# Patient Record
Sex: Female | Born: 1940 | Race: White | Hispanic: No | Smoking: Current every day smoker
Health system: Southern US, Community
[De-identification: ages and names within clinical notes are randomized; demographics above are authoritative.]

## PROBLEM LIST (undated history)

## (undated) DIAGNOSIS — L409 Psoriasis, unspecified: Secondary | ICD-10-CM

## (undated) DIAGNOSIS — IMO0002 Reserved for concepts with insufficient information to code with codable children: Secondary | ICD-10-CM

## (undated) DIAGNOSIS — I639 Cerebral infarction, unspecified: Secondary | ICD-10-CM

## (undated) DIAGNOSIS — I872 Venous insufficiency (chronic) (peripheral): Secondary | ICD-10-CM

## (undated) DIAGNOSIS — I6529 Occlusion and stenosis of unspecified carotid artery: Secondary | ICD-10-CM

## (undated) DIAGNOSIS — M1990 Unspecified osteoarthritis, unspecified site: Secondary | ICD-10-CM

## (undated) DIAGNOSIS — I10 Essential (primary) hypertension: Secondary | ICD-10-CM

## (undated) DIAGNOSIS — I82409 Acute embolism and thrombosis of unspecified deep veins of unspecified lower extremity: Secondary | ICD-10-CM

## (undated) DIAGNOSIS — M2550 Pain in unspecified joint: Secondary | ICD-10-CM

## (undated) DIAGNOSIS — J449 Chronic obstructive pulmonary disease, unspecified: Secondary | ICD-10-CM

## (undated) DIAGNOSIS — I509 Heart failure, unspecified: Secondary | ICD-10-CM

## (undated) HISTORY — DX: Cerebral infarction, unspecified: I63.9

## (undated) HISTORY — DX: Unspecified osteoarthritis, unspecified site: M19.90

## (undated) HISTORY — DX: Psoriasis, unspecified: L40.9

## (undated) HISTORY — PX: BLADDER SURGERY: SHX569

## (undated) HISTORY — PX: ABDOMINAL HYSTERECTOMY: SHX81

## (undated) HISTORY — DX: Occlusion and stenosis of unspecified carotid artery: I65.29

## (undated) HISTORY — PX: FOOT SURGERY: SHX648

## (undated) HISTORY — DX: Reserved for concepts with insufficient information to code with codable children: IMO0002

## (undated) HISTORY — DX: Acute embolism and thrombosis of unspecified deep veins of unspecified lower extremity: I82.409

## (undated) HISTORY — DX: Essential (primary) hypertension: I10

## (undated) HISTORY — DX: Pain in unspecified joint: M25.50

## (undated) HISTORY — DX: Chronic obstructive pulmonary disease, unspecified: J44.9

---

## 2002-12-08 ENCOUNTER — Emergency Department (HOSPITAL_COMMUNITY): Admission: EM | Admit: 2002-12-08 | Discharge: 2002-12-08 | Payer: Self-pay | Admitting: Emergency Medicine

## 2002-12-08 ENCOUNTER — Encounter: Payer: Self-pay | Admitting: Emergency Medicine

## 2002-12-26 ENCOUNTER — Encounter: Payer: Self-pay | Admitting: Family Medicine

## 2002-12-26 ENCOUNTER — Ambulatory Visit (HOSPITAL_COMMUNITY): Admission: RE | Admit: 2002-12-26 | Discharge: 2002-12-26 | Payer: Self-pay | Admitting: Family Medicine

## 2006-01-22 ENCOUNTER — Inpatient Hospital Stay (HOSPITAL_COMMUNITY): Admission: EM | Admit: 2006-01-22 | Discharge: 2006-01-28 | Payer: Self-pay | Admitting: Emergency Medicine

## 2006-01-27 ENCOUNTER — Ambulatory Visit: Payer: Self-pay | Admitting: Orthopedic Surgery

## 2007-06-26 ENCOUNTER — Ambulatory Visit: Payer: Self-pay | Admitting: Internal Medicine

## 2007-06-26 DIAGNOSIS — N318 Other neuromuscular dysfunction of bladder: Secondary | ICD-10-CM

## 2007-06-26 DIAGNOSIS — F411 Generalized anxiety disorder: Secondary | ICD-10-CM | POA: Insufficient documentation

## 2007-06-26 DIAGNOSIS — J4489 Other specified chronic obstructive pulmonary disease: Secondary | ICD-10-CM | POA: Insufficient documentation

## 2007-06-26 DIAGNOSIS — Z87442 Personal history of urinary calculi: Secondary | ICD-10-CM

## 2007-06-26 DIAGNOSIS — R0602 Shortness of breath: Secondary | ICD-10-CM

## 2007-06-26 DIAGNOSIS — J449 Chronic obstructive pulmonary disease, unspecified: Secondary | ICD-10-CM

## 2007-06-26 DIAGNOSIS — N390 Urinary tract infection, site not specified: Secondary | ICD-10-CM

## 2007-06-26 DIAGNOSIS — M129 Arthropathy, unspecified: Secondary | ICD-10-CM | POA: Insufficient documentation

## 2007-06-26 DIAGNOSIS — I83009 Varicose veins of unspecified lower extremity with ulcer of unspecified site: Secondary | ICD-10-CM

## 2007-06-26 DIAGNOSIS — L97909 Non-pressure chronic ulcer of unspecified part of unspecified lower leg with unspecified severity: Secondary | ICD-10-CM

## 2007-06-27 ENCOUNTER — Telehealth (INDEPENDENT_AMBULATORY_CARE_PROVIDER_SITE_OTHER): Payer: Self-pay | Admitting: *Deleted

## 2007-06-27 ENCOUNTER — Encounter (INDEPENDENT_AMBULATORY_CARE_PROVIDER_SITE_OTHER): Payer: Self-pay | Admitting: Internal Medicine

## 2007-06-28 ENCOUNTER — Telehealth (INDEPENDENT_AMBULATORY_CARE_PROVIDER_SITE_OTHER): Payer: Self-pay | Admitting: *Deleted

## 2007-06-28 LAB — CONVERTED CEMR LAB
Basophils Absolute: 0.1 10*3/uL (ref 0.0–0.1)
Cholesterol: 193 mg/dL (ref 0–200)
HCT: 46.2 % — ABNORMAL HIGH (ref 36.0–46.0)
HDL: 35 mg/dL — ABNORMAL LOW (ref >39)
Hemoglobin: 14.9 g/dL (ref 12.0–15.0)
Lymphocytes Relative: 19 % (ref 12–46)
Lymphs Abs: 2.3 10*3/uL (ref 0.7–4.0)
Neutro Abs: 8.4 10*3/uL — ABNORMAL HIGH (ref 1.7–7.7)
Platelets: 381 10*3/uL (ref 150–400)
RDW: 14.1 % (ref 11.5–15.5)
Total CHOL/HDL Ratio: 5.5
VLDL: 35 mg/dL (ref 0–40)
WBC: 12 10*3/uL — ABNORMAL HIGH (ref 4.0–10.5)

## 2007-07-04 ENCOUNTER — Ambulatory Visit (HOSPITAL_COMMUNITY): Admission: RE | Admit: 2007-07-04 | Discharge: 2007-07-04 | Payer: Self-pay | Admitting: Internal Medicine

## 2007-07-04 ENCOUNTER — Encounter (INDEPENDENT_AMBULATORY_CARE_PROVIDER_SITE_OTHER): Payer: Self-pay | Admitting: Internal Medicine

## 2007-07-05 ENCOUNTER — Ambulatory Visit: Payer: Self-pay | Admitting: Internal Medicine

## 2007-07-05 DIAGNOSIS — R030 Elevated blood-pressure reading, without diagnosis of hypertension: Secondary | ICD-10-CM

## 2007-07-08 ENCOUNTER — Telehealth (INDEPENDENT_AMBULATORY_CARE_PROVIDER_SITE_OTHER): Payer: Self-pay | Admitting: *Deleted

## 2007-07-22 ENCOUNTER — Telehealth (INDEPENDENT_AMBULATORY_CARE_PROVIDER_SITE_OTHER): Payer: Self-pay | Admitting: *Deleted

## 2007-07-23 ENCOUNTER — Encounter (INDEPENDENT_AMBULATORY_CARE_PROVIDER_SITE_OTHER): Payer: Self-pay | Admitting: Internal Medicine

## 2007-07-31 ENCOUNTER — Ambulatory Visit: Payer: Self-pay | Admitting: Internal Medicine

## 2007-09-05 ENCOUNTER — Ambulatory Visit (HOSPITAL_COMMUNITY): Admission: RE | Admit: 2007-09-05 | Discharge: 2007-09-05 | Payer: Self-pay | Admitting: Ophthalmology

## 2007-10-10 ENCOUNTER — Ambulatory Visit (HOSPITAL_COMMUNITY): Admission: RE | Admit: 2007-10-10 | Discharge: 2007-10-10 | Payer: Self-pay | Admitting: Ophthalmology

## 2008-01-13 ENCOUNTER — Ambulatory Visit: Payer: Self-pay | Admitting: Internal Medicine

## 2008-01-13 DIAGNOSIS — J441 Chronic obstructive pulmonary disease with (acute) exacerbation: Secondary | ICD-10-CM | POA: Insufficient documentation

## 2008-01-27 ENCOUNTER — Ambulatory Visit (HOSPITAL_COMMUNITY): Admission: RE | Admit: 2008-01-27 | Discharge: 2008-01-27 | Payer: Self-pay | Admitting: Internal Medicine

## 2008-01-27 ENCOUNTER — Ambulatory Visit: Payer: Self-pay | Admitting: Internal Medicine

## 2008-01-27 LAB — CONVERTED CEMR LAB
Basophils Absolute: 0 10*3/uL (ref 0.0–0.1)
Basophils Relative: 0 % (ref 0–1)
Calcium: 8.7 mg/dL (ref 8.4–10.5)
Eosinophils Absolute: 1 10*3/uL — ABNORMAL HIGH (ref 0.0–0.7)
Eosinophils Relative: 7 % — ABNORMAL HIGH (ref 0–5)
Glucose, Bld: 122 mg/dL — ABNORMAL HIGH (ref 70–99)
HCT: 34.4 % — ABNORMAL LOW (ref 36.0–46.0)
Lymphs Abs: 1.4 10*3/uL (ref 0.7–4.0)
MCHC: 33.5 g/dL (ref 30.0–36.0)
MCV: 89.5 fL (ref 78.0–100.0)
Neutrophils Relative %: 75 % (ref 43–77)
Platelets: 366 10*3/uL (ref 150–400)
Pro B Natriuretic peptide (BNP): 107 pg/mL — ABNORMAL HIGH (ref 0.0–100.0)
RDW: 13.8 % (ref 11.5–15.5)
Sodium: 139 meq/L (ref 135–145)

## 2008-02-03 ENCOUNTER — Ambulatory Visit: Payer: Self-pay | Admitting: Internal Medicine

## 2008-10-10 ENCOUNTER — Encounter (INDEPENDENT_AMBULATORY_CARE_PROVIDER_SITE_OTHER): Payer: Self-pay | Admitting: Internal Medicine

## 2008-10-26 ENCOUNTER — Ambulatory Visit: Payer: Self-pay | Admitting: Internal Medicine

## 2008-11-02 ENCOUNTER — Ambulatory Visit: Payer: Self-pay | Admitting: Internal Medicine

## 2008-11-02 DIAGNOSIS — L03119 Cellulitis of unspecified part of limb: Secondary | ICD-10-CM

## 2008-11-02 DIAGNOSIS — L02419 Cutaneous abscess of limb, unspecified: Secondary | ICD-10-CM

## 2008-11-02 LAB — CONVERTED CEMR LAB
Basophils Absolute: 0.1 10*3/uL (ref 0.0–0.1)
Hemoglobin: 12.5 g/dL (ref 12.0–15.0)
Lymphocytes Relative: 16 % (ref 12–46)
Lymphs Abs: 1.8 10*3/uL (ref 0.7–4.0)
Monocytes Absolute: 0.7 10*3/uL (ref 0.1–1.0)
Neutro Abs: 8.1 10*3/uL — ABNORMAL HIGH (ref 1.7–7.7)
RDW: 14.3 % (ref 11.5–15.5)
Sed Rate: 64 mm/hr — ABNORMAL HIGH (ref 0–22)
WBC: 11.3 10*3/uL — ABNORMAL HIGH (ref 4.0–10.5)

## 2008-11-03 ENCOUNTER — Encounter (INDEPENDENT_AMBULATORY_CARE_PROVIDER_SITE_OTHER): Payer: Self-pay | Admitting: Internal Medicine

## 2008-11-03 ENCOUNTER — Ambulatory Visit (HOSPITAL_COMMUNITY): Admission: RE | Admit: 2008-11-03 | Discharge: 2008-11-03 | Payer: Self-pay | Admitting: Internal Medicine

## 2008-11-03 LAB — CONVERTED CEMR LAB
CO2: 26 meq/L (ref 19–32)
Calcium: 9.3 mg/dL (ref 8.4–10.5)
Sodium: 138 meq/L (ref 135–145)

## 2008-11-06 ENCOUNTER — Ambulatory Visit: Payer: Self-pay | Admitting: Internal Medicine

## 2008-11-10 ENCOUNTER — Encounter (INDEPENDENT_AMBULATORY_CARE_PROVIDER_SITE_OTHER): Payer: Self-pay | Admitting: Internal Medicine

## 2008-11-20 ENCOUNTER — Encounter (INDEPENDENT_AMBULATORY_CARE_PROVIDER_SITE_OTHER): Payer: Self-pay | Admitting: Internal Medicine

## 2009-06-08 ENCOUNTER — Inpatient Hospital Stay (HOSPITAL_COMMUNITY)
Admission: AD | Admit: 2009-06-08 | Discharge: 2009-06-16 | Payer: Self-pay | Source: Home / Self Care | Admitting: Family Medicine

## 2009-07-02 ENCOUNTER — Inpatient Hospital Stay (HOSPITAL_COMMUNITY): Admission: EM | Admit: 2009-07-02 | Discharge: 2009-07-08 | Payer: Self-pay | Admitting: Emergency Medicine

## 2009-08-25 ENCOUNTER — Observation Stay (HOSPITAL_COMMUNITY): Admission: EM | Admit: 2009-08-25 | Discharge: 2009-08-27 | Payer: Self-pay | Admitting: Emergency Medicine

## 2010-01-05 ENCOUNTER — Inpatient Hospital Stay (HOSPITAL_COMMUNITY): Admission: EM | Admit: 2010-01-05 | Discharge: 2010-01-10 | Payer: Self-pay | Admitting: Emergency Medicine

## 2010-03-01 ENCOUNTER — Inpatient Hospital Stay (HOSPITAL_COMMUNITY)
Admission: EM | Admit: 2010-03-01 | Discharge: 2010-03-12 | Disposition: A | Discharge status: Other Acute Inpatient Hospital | Payer: Self-pay | Source: Home / Self Care | Attending: Family Medicine | Admitting: Family Medicine

## 2010-03-08 ENCOUNTER — Encounter: Payer: Self-pay | Admitting: Neurology

## 2010-03-08 ENCOUNTER — Inpatient Hospital Stay (HOSPITAL_COMMUNITY)
Admission: AD | Admit: 2010-03-08 | Discharge: 2010-03-12 | Payer: Self-pay | Attending: Internal Medicine | Admitting: Internal Medicine

## 2010-03-13 HISTORY — PX: CAROTID STENT: SHX1301

## 2010-03-21 ENCOUNTER — Encounter
Admission: RE | Admit: 2010-03-21 | Discharge: 2010-03-21 | Payer: Self-pay | Source: Home / Self Care | Attending: Surgery | Admitting: Surgery

## 2010-03-21 ENCOUNTER — Ambulatory Visit
Admission: RE | Admit: 2010-03-21 | Discharge: 2010-03-21 | Payer: Self-pay | Source: Home / Self Care | Attending: Surgery | Admitting: Surgery

## 2010-03-21 ENCOUNTER — Encounter: Admission: RE | Admit: 2010-03-21 | Payer: Self-pay | Source: Home / Self Care | Admitting: Surgery

## 2010-04-03 ENCOUNTER — Encounter: Payer: Self-pay | Admitting: Family Medicine

## 2010-04-05 ENCOUNTER — Inpatient Hospital Stay (HOSPITAL_COMMUNITY)
Admission: RE | Admit: 2010-04-05 | Discharge: 2010-04-05 | Payer: Self-pay | Source: Home / Self Care | Attending: Surgery | Admitting: Surgery

## 2010-04-06 LAB — POCT I-STAT, CHEM 8
BUN: 48 mg/dL — ABNORMAL HIGH (ref 6–23)
Creatinine, Ser: 1.3 mg/dL — ABNORMAL HIGH (ref 0.4–1.2)
Hemoglobin: 12.2 g/dL (ref 12.0–15.0)
Potassium: 4.6 mEq/L (ref 3.5–5.1)
Sodium: 139 mEq/L (ref 135–145)
TCO2: 28 mmol/L (ref 0–100)

## 2010-04-06 LAB — GLUCOSE, CAPILLARY
Glucose-Capillary: 97 mg/dL (ref 70–99)
Glucose-Capillary: 90 mg/dL (ref 70–99)

## 2010-04-13 ENCOUNTER — Other Ambulatory Visit: Payer: Self-pay | Admitting: Surgery

## 2010-04-13 DIAGNOSIS — R222 Localized swelling, mass and lump, trunk: Secondary | ICD-10-CM

## 2010-04-24 NOTE — Op Note (Addendum)
NAMEAYBREE, LANYON NO.:  [id number]  MEDICAL RECORD NO.:  [id number]          PATIENT TYPE:  INP  LOCATION:  2852                         FACILITY:  [facility]  PHYSICIAN:  Juleen China IV, MDDATE OF BIRTH:  1940/08/02  DATE OF PROCEDURE:  04/05/2010 DATE OF DISCHARGE:                              OPERATIVE REPORT   PREOPERATIVE DIAGNOSIS:  Right brain stroke.  POSTOPERATIVE DIAGNOSIS:  Right brain stroke.  PROCEDURES PERFORMED: 1. Ultrasound access right femoral artery. 2. Aortic arch angiogram. 3. Bilateral carotid angiogram.  INDICATIONS:  Ms. Rodier is a 70 year old female who, over Christmas, developed signs and symptoms of a right brain stroke.  MRA confirmed acute process on the right brain with chronic old strokes on the left side.  Ultrasound revealed 70% carotid stenosis bilaterally.  The patient has recovered from her stroke.  I studied her with a CT scan which suggested greater than 50% stenosis in the right carotid artery and a high-grade left carotid stenosis.  The patient comes in today for diagnostic carotid angiogram and possible right carotid stent.  PROCEDURE:  The patient was identified in the holding area, taken to room 8, placed supine on the table.  Both groins were prepped and draped in the usual fashion.  Time-out was called.  The right femoral artery was evaluated with ultrasound and found to be widely patent.  Digital image was acquired.  It was then accessed under ultrasound guidance with an 18-gauge needle.  A 0.35 wire was advanced in the aorta under fluoroscopic visualization and a 5-French sheath was placed.  Next, a pigtail catheter was advanced over the wire and placed in the ascending aorta and an aortic arch angiogram was performed.  Next, using a Bernstein II catheter, the innominate artery was selected.  The catheter was then advanced into the right carotid and right carotid angiogram was performed with  intracranial images.  Next, using the [facility] II catheter, the left carotid artery was selected and left carotid angiogram was performed with intracranial imaging.  Intracranial interpretation will be done by Neuroradiology.  FINDINGS: 1. Aortic arch:  A type 1 aortic arch is visualized.  The innominate     and right subclavian artery appear widely patent.  The vertebral     artery arises from the right subclavian which is widely patent.     The right common carotid artery is widely patent.  The left common     carotid artery is widely patent.  There is mild luminal     irregularity at the origin of left subclavian artery, but is     otherwise patent.  The left vertebral artery originates from the     left subclavian. 2. Right carotid artery.  Multiple oblique images were performed.     Looking at the right carotid artery, there is mild stenosis at the     origin of the external carotid artery.  There is no significant     luminal narrowing within the right internal carotid artery.  There     is a kink, but does not produce hemodynamically significant  stenosis. 3. Left carotid artery:  There is a focal high-grade approximately 95%     stenosis in the left carotid artery just distal to its origin.  The     external carotid artery is patent.  After the above images were obtained, no intervention was performed today.  I will bring the patient back and electively treat her left carotid artery which is her asymptomatic side.  Catheters and wires were removed.  The patient was taken to holding area for sheath pull.  IMPRESSION: 1. No significant right internal carotid stenosis. 2. High-grade, greater than 90% stenosis in the left internal carotid     artery.     Jorge Ny, MD     VWB/MEDQ  D:  04/05/2010  T:  04/05/2010  Job:  [id number]  Electronically Signed by Arelia Longest IV MD on 04/24/2010 09:58:14 PM

## 2010-04-25 ENCOUNTER — Ambulatory Visit
Admission: RE | Admit: 2010-04-25 | Discharge: 2010-04-25 | Disposition: A | Discharge status: Home / Self Care | Payer: Medicare HMO | Source: Ambulatory Visit | Attending: Surgery | Admitting: Surgery

## 2010-04-25 ENCOUNTER — Ambulatory Visit (INDEPENDENT_AMBULATORY_CARE_PROVIDER_SITE_OTHER): Payer: Medicare HMO | Admitting: Surgery

## 2010-04-25 ENCOUNTER — Other Ambulatory Visit: Payer: Self-pay | Admitting: Surgery

## 2010-04-25 DIAGNOSIS — I6529 Occlusion and stenosis of unspecified carotid artery: Secondary | ICD-10-CM

## 2010-04-25 DIAGNOSIS — R222 Localized swelling, mass and lump, trunk: Secondary | ICD-10-CM

## 2010-04-26 NOTE — Assessment & Plan Note (Signed)
OFFICE VISIT  Jill Robinson, Jill Robinson DOB:  May 20, 1940                                       04/25/2010 ZOXWR#:[id number]  The patient comes back today for followup.  She had a right brain stroke in late December.  Imaging revealed a moderately stenosed right carotid artery.  Imaging also revealed chronic old left-sided strokes.  She was set up for carotid stenting because I felt that her total body psoriasis made her a high risk operative candidate.  At the time of her angiogram she was found to have minimal stenosis in her right carotid artery and therefore no intervention was performed.  I did confirm greater than 90% stenosis on the left.  I elected not to intervene at that time because she was recovering from a stroke and we had not discussed proceeding. She comes back in today to discuss stenting of the left side.  She has continued to recover from her previous stroke.  She is back to near normal.  She has had a recent exacerbation of her psoriasis where she switched her cream and medicine and has had a severe break out with skin sloughing and itching all over.  PHYSICAL EXAMINATION:  Vital signs:  Her heart rate is 86, blood pressure 134/74, respiratory rate 28.  General:  She is well-appearing, mild distress.  Respirations are nonlabored.  Skin:  She has had a significant worsening of her psoriatic condition with sloughing and scaling all over.  ASSESSMENT:  High-grade left carotid stenosis.  PLAN:  The patient has been scheduled for a left carotid stent with distal embolic protection to be performed on Tuesday February 28.  She is to continue her Plavix.  She cannot take aspirin due to GI upset but we encouraged her to continue her Plavix.  Again, she is an extremely high risk operative candidate given her psoriatic skin condition.  We discussed the risks and benefits of the procedure including the risk of stroke.  This will be done in conjunction  with Dr. Allyson Sabal.    Jorge Ny, MD Electronically Signed  VWB/MEDQ  D:  04/25/2010  T:  04/26/2010  Job:  [id number]  cc:   Nanetta Batty, M.D.

## 2010-05-10 DIAGNOSIS — R0989 Other specified symptoms and signs involving the circulatory and respiratory systems: Secondary | ICD-10-CM

## 2010-05-23 LAB — CBC
HCT: 31.5 % — ABNORMAL LOW (ref 36.0–46.0)
HCT: 28.4 % — ABNORMAL LOW (ref 36.0–46.0)
HCT: 30.3 % — ABNORMAL LOW (ref 36.0–46.0)
HCT: 31.3 % — ABNORMAL LOW (ref 36.0–46.0)
Hemoglobin: 8.4 g/dL — ABNORMAL LOW (ref 12.0–15.0)
Hemoglobin: 8.9 g/dL — ABNORMAL LOW (ref 12.0–15.0)
MCH: 27.4 pg (ref 26.0–34.0)
MCH: 26.5 pg (ref 26.0–34.0)
MCH: 26.6 pg (ref 26.0–34.0)
MCHC: 31.4 g/dL (ref 30.0–36.0)
MCHC: 29.4 g/dL — ABNORMAL LOW (ref 30.0–36.0)
MCHC: 29.6 g/dL — ABNORMAL LOW (ref 30.0–36.0)
MCHC: 29.7 g/dL — ABNORMAL LOW (ref 30.0–36.0)
MCV: 87.3 fL (ref 78.0–100.0)
MCV: 89.7 fL (ref 78.0–100.0)
Platelets: 364 10*3/uL (ref 150–400)
Platelets: 461 10*3/uL — ABNORMAL HIGH (ref 150–400)
RBC: 3.16 MIL/uL — ABNORMAL LOW (ref 3.87–5.11)
RBC: 3.17 MIL/uL — ABNORMAL LOW (ref 3.87–5.11)
RDW: 16 % — ABNORMAL HIGH (ref 11.5–15.5)
RDW: 16.7 % — ABNORMAL HIGH (ref 11.5–15.5)
RDW: 16.7 % — ABNORMAL HIGH (ref 11.5–15.5)
WBC: 10.2 10*3/uL (ref 4.0–10.5)

## 2010-05-23 LAB — BASIC METABOLIC PANEL
BUN: 7 mg/dL (ref 6–23)
BUN: 8 mg/dL (ref 6–23)
CO2: 34 mEq/L — ABNORMAL HIGH (ref 19–32)
CO2: 35 mEq/L — ABNORMAL HIGH (ref 19–32)
Calcium: 8.1 mg/dL — ABNORMAL LOW (ref 8.4–10.5)
Calcium: 8.3 mg/dL — ABNORMAL LOW (ref 8.4–10.5)
Chloride: 100 mEq/L (ref 96–112)
Creatinine, Ser: 0.89 mg/dL (ref 0.4–1.2)
Creatinine, Ser: 0.93 mg/dL (ref 0.4–1.2)
GFR calc Af Amer: >60 mL/min (ref >60)
GFR calc Af Amer: >60 mL/min (ref >60)
GFR calc non Af Amer: >60 mL/min (ref >60)
GFR calc non Af Amer: >60 mL/min (ref >60)
Glucose, Bld: 102 mg/dL — ABNORMAL HIGH (ref 70–99)
Glucose, Bld: 91 mg/dL (ref 70–99)
Potassium: 4.2 mEq/L (ref 3.5–5.1)
Potassium: 4.7 mEq/L (ref 3.5–5.1)
Sodium: 139 mEq/L (ref 135–145)

## 2010-05-23 LAB — LIPID PANEL
Cholesterol: 106 mg/dL (ref 0–200)
HDL: 26 mg/dL — ABNORMAL LOW (ref >39)
Triglycerides: 83 mg/dL (ref <150)

## 2010-05-23 LAB — VITAMIN B12: Vitamin B-12: 405 pg/mL (ref 211–911)

## 2010-05-23 LAB — URINE CULTURE
Culture  Setup Time: 201112211105
Special Requests: NEGATIVE

## 2010-05-23 LAB — GLUCOSE, CAPILLARY
Glucose-Capillary: 103 mg/dL — ABNORMAL HIGH (ref 70–99)
Glucose-Capillary: 103 mg/dL — ABNORMAL HIGH (ref 70–99)
Glucose-Capillary: 109 mg/dL — ABNORMAL HIGH (ref 70–99)
Glucose-Capillary: 112 mg/dL — ABNORMAL HIGH (ref 70–99)
Glucose-Capillary: 177 mg/dL — ABNORMAL HIGH (ref 70–99)
Glucose-Capillary: 77 mg/dL (ref 70–99)
Glucose-Capillary: 78 mg/dL (ref 70–99)
Glucose-Capillary: 87 mg/dL (ref 70–99)
Glucose-Capillary: 92 mg/dL (ref 70–99)
Glucose-Capillary: 92 mg/dL (ref 70–99)
Glucose-Capillary: 95 mg/dL (ref 70–99)
Glucose-Capillary: 97 mg/dL (ref 70–99)
Glucose-Capillary: 103 mg/dL — ABNORMAL HIGH (ref 70–99)
Glucose-Capillary: 112 mg/dL — ABNORMAL HIGH (ref 70–99)
Glucose-Capillary: 113 mg/dL — ABNORMAL HIGH (ref 70–99)
Glucose-Capillary: 126 mg/dL — ABNORMAL HIGH (ref 70–99)
Glucose-Capillary: 85 mg/dL (ref 70–99)
Glucose-Capillary: 87 mg/dL (ref 70–99)
Glucose-Capillary: 93 mg/dL (ref 70–99)
Glucose-Capillary: 97 mg/dL (ref 70–99)

## 2010-05-23 LAB — URINALYSIS, ROUTINE W REFLEX MICROSCOPIC
Bilirubin Urine: NEGATIVE
Glucose, UA: NEGATIVE mg/dL
Nitrite: NEGATIVE
Specific Gravity, Urine: 1.02 (ref 1.005–1.030)
Specific Gravity, Urine: 1.007 (ref 1.005–1.030)
Urobilinogen, UA: 0.2 mg/dL (ref 0.0–1.0)
pH: 7 (ref 5.0–8.0)
pH: 6 (ref 5.0–8.0)

## 2010-05-23 LAB — COMPREHENSIVE METABOLIC PANEL
ALT: 8 U/L (ref 0–35)
Alkaline Phosphatase: 85 U/L (ref 39–117)
BUN: 8 mg/dL (ref 6–23)
CO2: 30 mEq/L (ref 19–32)
Calcium: 8.8 mg/dL (ref 8.4–10.5)
Calcium: 8.3 mg/dL — ABNORMAL LOW (ref 8.4–10.5)
GFR calc non Af Amer: 58 mL/min — ABNORMAL LOW (ref >60)
Glucose, Bld: 76 mg/dL (ref 70–99)
Glucose, Bld: 96 mg/dL (ref 70–99)
Sodium: 137 mEq/L (ref 135–145)
Total Bilirubin: 0.3 mg/dL (ref 0.3–1.2)
Total Protein: 7.2 g/dL (ref 6.0–8.3)

## 2010-05-23 LAB — DIFFERENTIAL
Basophils Relative: 1 % (ref 0–1)
Monocytes Relative: 9 % (ref 3–12)
Neutro Abs: 7.9 10*3/uL — ABNORMAL HIGH (ref 1.7–7.7)
Neutrophils Relative %: 65 % (ref 43–77)

## 2010-05-23 LAB — URINE MICROSCOPIC-ADD ON

## 2010-05-23 LAB — IRON AND TIBC
Iron: 12 ug/dL — ABNORMAL LOW (ref 42–135)
UIBC: 146 ug/dL

## 2010-05-23 LAB — WOUND CULTURE

## 2010-05-23 LAB — HEMOGLOBIN A1C: Hgb A1c MFr Bld: 5.9 % — ABNORMAL HIGH (ref <5.7)

## 2010-05-23 LAB — CULTURE, BLOOD (ROUTINE X 2)

## 2010-05-23 LAB — PROTIME-INR: INR: 1.07 (ref 0.00–1.49)

## 2010-05-23 LAB — VANCOMYCIN, RANDOM
Vancomycin Rm: 17.2 ug/mL
Vancomycin Rm: 26.6 ug/mL

## 2010-05-23 LAB — BRAIN NATRIURETIC PEPTIDE: Pro B Natriuretic peptide (BNP): 409 pg/mL — ABNORMAL HIGH (ref 0.0–100.0)

## 2010-05-23 LAB — FERRITIN: Ferritin: 47 ng/mL (ref 10–291)

## 2010-05-23 LAB — FOLATE: Folate: 5.4 ng/mL

## 2010-05-23 LAB — MRSA PCR SCREENING: MRSA by PCR: POSITIVE — AB

## 2010-05-25 LAB — GLUCOSE, CAPILLARY
Glucose-Capillary: 117 mg/dL — ABNORMAL HIGH (ref 70–99)
Glucose-Capillary: 164 mg/dL — ABNORMAL HIGH (ref 70–99)
Glucose-Capillary: 185 mg/dL — ABNORMAL HIGH (ref 70–99)
Glucose-Capillary: 186 mg/dL — ABNORMAL HIGH (ref 70–99)
Glucose-Capillary: 216 mg/dL — ABNORMAL HIGH (ref 70–99)

## 2010-05-25 LAB — BASIC METABOLIC PANEL
BUN: 9 mg/dL (ref 6–23)
Chloride: 103 mEq/L (ref 96–112)
Creatinine, Ser: 0.81 mg/dL (ref 0.4–1.2)
GFR calc Af Amer: >60 mL/min (ref >60)
GFR calc non Af Amer: >60 mL/min (ref >60)
Potassium: 4.6 mEq/L (ref 3.5–5.1)

## 2010-05-25 LAB — URINE MICROSCOPIC-ADD ON

## 2010-05-25 LAB — CK TOTAL AND CKMB (NOT AT ARMC)
CK, MB: 1 ng/mL (ref 0.3–4.0)
Total CK: 21 U/L (ref 7–177)

## 2010-05-25 LAB — DIFFERENTIAL
Basophils Absolute: 0.1 10*3/uL (ref 0.0–0.1)
Lymphocytes Relative: 23 % (ref 12–46)
Lymphs Abs: 1.7 10*3/uL (ref 0.7–4.0)
Neutro Abs: 4.6 10*3/uL (ref 1.7–7.7)

## 2010-05-25 LAB — CBC
HCT: 33.1 % — ABNORMAL LOW (ref 36.0–46.0)
Platelets: 336 10*3/uL (ref 150–400)
RBC: 3.9 MIL/uL (ref 3.87–5.11)
RDW: 16.1 % — ABNORMAL HIGH (ref 11.5–15.5)
WBC: 7.4 10*3/uL (ref 4.0–10.5)

## 2010-05-25 LAB — URINALYSIS, ROUTINE W REFLEX MICROSCOPIC
Bilirubin Urine: NEGATIVE
Bilirubin Urine: NEGATIVE
Glucose, UA: NEGATIVE mg/dL
Glucose, UA: NEGATIVE mg/dL
Ketones, ur: NEGATIVE mg/dL
Ketones, ur: NEGATIVE mg/dL
Nitrite: NEGATIVE
Protein, ur: NEGATIVE mg/dL
Specific Gravity, Urine: >1.03 — ABNORMAL HIGH (ref 1.005–1.030)
pH: 6 (ref 5.0–8.0)

## 2010-05-25 LAB — BRAIN NATRIURETIC PEPTIDE: Pro B Natriuretic peptide (BNP): 282 pg/mL — ABNORMAL HIGH (ref 0.0–100.0)

## 2010-05-25 LAB — TROPONIN I: Troponin I: 0.04 ng/mL (ref 0.00–0.06)

## 2010-05-29 LAB — PROTIME-INR
INR: 2.25 — ABNORMAL HIGH (ref 0.00–1.49)
INR: 6.49 (ref 0.00–1.49)
INR: >10.01 (ref 0.00–1.49)
Prothrombin Time: 24.7 seconds — ABNORMAL HIGH (ref 11.6–15.2)
Prothrombin Time: 56.5 seconds — ABNORMAL HIGH (ref 11.6–15.2)
Prothrombin Time: 80.7 seconds — ABNORMAL HIGH (ref 11.6–15.2)

## 2010-05-29 LAB — CBC
HCT: 30.7 % — ABNORMAL LOW (ref 36.0–46.0)
Hemoglobin: 10.8 g/dL — ABNORMAL LOW (ref 12.0–15.0)
MCHC: 33.2 g/dL (ref 30.0–36.0)
Platelets: 459 10*3/uL — ABNORMAL HIGH (ref 150–400)
Platelets: 516 10*3/uL — ABNORMAL HIGH (ref 150–400)
RBC: 3.8 MIL/uL — ABNORMAL LOW (ref 3.87–5.11)
RDW: 13.5 % (ref 11.5–15.5)
RDW: 14.3 % (ref 11.5–15.5)
WBC: 10.7 10*3/uL — ABNORMAL HIGH (ref 4.0–10.5)
WBC: 11 10*3/uL — ABNORMAL HIGH (ref 4.0–10.5)

## 2010-05-29 LAB — DIFFERENTIAL
Basophils Absolute: 0.1 10*3/uL (ref 0.0–0.1)
Basophils Absolute: 0.1 10*3/uL (ref 0.0–0.1)
Basophils Relative: 1 % (ref 0–1)
Eosinophils Relative: 6 % — ABNORMAL HIGH (ref 0–5)
Lymphocytes Relative: 16 % (ref 12–46)
Lymphocytes Relative: 17 % (ref 12–46)
Neutro Abs: 7.3 10*3/uL (ref 1.7–7.7)
Neutrophils Relative %: 68 % (ref 43–77)
Neutrophils Relative %: 69 % (ref 43–77)

## 2010-05-29 LAB — GLUCOSE, CAPILLARY

## 2010-05-29 LAB — APTT: aPTT: 150 seconds — ABNORMAL HIGH (ref 24–37)

## 2010-05-29 LAB — MRSA PCR SCREENING: MRSA by PCR: NEGATIVE

## 2010-05-31 LAB — BASIC METABOLIC PANEL
BUN: 6 mg/dL (ref 6–23)
BUN: 7 mg/dL (ref 6–23)
BUN: 7 mg/dL (ref 6–23)
CO2: 33 mEq/L — ABNORMAL HIGH (ref 19–32)
CO2: 36 mEq/L — ABNORMAL HIGH (ref 19–32)
Calcium: 8.3 mg/dL — ABNORMAL LOW (ref 8.4–10.5)
Calcium: 8.9 mg/dL (ref 8.4–10.5)
Chloride: 101 mEq/L (ref 96–112)
Chloride: 98 mEq/L (ref 96–112)
Creatinine, Ser: 0.76 mg/dL (ref 0.4–1.2)
Creatinine, Ser: 0.77 mg/dL (ref 0.4–1.2)
GFR calc Af Amer: >60 mL/min (ref >60)
GFR calc Af Amer: >60 mL/min (ref >60)
GFR calc non Af Amer: >60 mL/min (ref >60)
GFR calc non Af Amer: >60 mL/min (ref >60)
GFR calc non Af Amer: >60 mL/min (ref >60)
GFR calc non Af Amer: >60 mL/min (ref >60)
GFR calc non Af Amer: >60 mL/min (ref >60)
Glucose, Bld: 110 mg/dL — ABNORMAL HIGH (ref 70–99)
Glucose, Bld: 91 mg/dL (ref 70–99)
Potassium: 3.8 mEq/L (ref 3.5–5.1)
Potassium: 3.9 mEq/L (ref 3.5–5.1)
Potassium: 4 mEq/L (ref 3.5–5.1)
Potassium: 4 mEq/L (ref 3.5–5.1)
Sodium: 134 mEq/L — ABNORMAL LOW (ref 135–145)
Sodium: 138 mEq/L (ref 135–145)
Sodium: 140 mEq/L (ref 135–145)
Sodium: 140 mEq/L (ref 135–145)

## 2010-05-31 LAB — DIFFERENTIAL
Basophils Absolute: 0.1 10*3/uL (ref 0.0–0.1)
Basophils Relative: 0 % (ref 0–1)
Basophils Relative: 1 % (ref 0–1)
Eosinophils Absolute: 0.7 10*3/uL (ref 0.0–0.7)
Eosinophils Absolute: 0.7 10*3/uL (ref 0.0–0.7)
Eosinophils Absolute: 0.7 10*3/uL (ref 0.0–0.7)
Eosinophils Absolute: 0.8 10*3/uL — ABNORMAL HIGH (ref 0.0–0.7)
Eosinophils Relative: 7 % — ABNORMAL HIGH (ref 0–5)
Eosinophils Relative: 7 % — ABNORMAL HIGH (ref 0–5)
Eosinophils Relative: 7 % — ABNORMAL HIGH (ref 0–5)
Lymphocytes Relative: 17 % (ref 12–46)
Lymphocytes Relative: 19 % (ref 12–46)
Lymphocytes Relative: 19 % (ref 12–46)
Lymphocytes Relative: 19 % (ref 12–46)
Lymphs Abs: 1.2 10*3/uL (ref 0.7–4.0)
Lymphs Abs: 1.6 10*3/uL (ref 0.7–4.0)
Lymphs Abs: 1.7 10*3/uL (ref 0.7–4.0)
Lymphs Abs: 1.8 10*3/uL (ref 0.7–4.0)
Lymphs Abs: 2 10*3/uL (ref 0.7–4.0)
Monocytes Absolute: 0.7 10*3/uL (ref 0.1–1.0)
Monocytes Absolute: 0.8 10*3/uL (ref 0.1–1.0)
Monocytes Relative: 6 % (ref 3–12)
Monocytes Relative: 7 % (ref 3–12)
Monocytes Relative: 7 % (ref 3–12)
Monocytes Relative: 8 % (ref 3–12)
Neutro Abs: 5.3 10*3/uL (ref 1.7–7.7)
Neutro Abs: 6.2 10*3/uL (ref 1.7–7.7)
Neutro Abs: 7.2 10*3/uL (ref 1.7–7.7)
Neutrophils Relative %: 64 % (ref 43–77)
Neutrophils Relative %: 66 % (ref 43–77)
Neutrophils Relative %: 67 % (ref 43–77)

## 2010-05-31 LAB — PROTIME-INR
INR: 1.07 (ref 0.00–1.49)
INR: 1.28 (ref 0.00–1.49)
INR: 1.45 (ref 0.00–1.49)
Prothrombin Time: 13.3 seconds (ref 11.6–15.2)
Prothrombin Time: 13.8 seconds (ref 11.6–15.2)
Prothrombin Time: 15.9 seconds — ABNORMAL HIGH (ref 11.6–15.2)
Prothrombin Time: 17.5 seconds — ABNORMAL HIGH (ref 11.6–15.2)

## 2010-05-31 LAB — CBC
HCT: 30.6 % — ABNORMAL LOW (ref 36.0–46.0)
HCT: 31.5 % — ABNORMAL LOW (ref 36.0–46.0)
HCT: 32.4 % — ABNORMAL LOW (ref 36.0–46.0)
Hemoglobin: 10 g/dL — ABNORMAL LOW (ref 12.0–15.0)
Hemoglobin: 10.5 g/dL — ABNORMAL LOW (ref 12.0–15.0)
MCHC: 33.8 g/dL (ref 30.0–36.0)
MCHC: 33.9 g/dL (ref 30.0–36.0)
MCV: 87.3 fL (ref 78.0–100.0)
MCV: 87.3 fL (ref 78.0–100.0)
MCV: 87.9 fL (ref 78.0–100.0)
Platelets: 263 10*3/uL (ref 150–400)
Platelets: 300 10*3/uL (ref 150–400)
Platelets: 307 10*3/uL (ref 150–400)
RBC: 3.4 MIL/uL — ABNORMAL LOW (ref 3.87–5.11)
RBC: 3.51 MIL/uL — ABNORMAL LOW (ref 3.87–5.11)
RBC: 3.53 MIL/uL — ABNORMAL LOW (ref 3.87–5.11)
RBC: 3.87 MIL/uL (ref 3.87–5.11)
RDW: 16.1 % — ABNORMAL HIGH (ref 11.5–15.5)
RDW: 16.1 % — ABNORMAL HIGH (ref 11.5–15.5)
WBC: 10.2 10*3/uL (ref 4.0–10.5)
WBC: 10.7 10*3/uL — ABNORMAL HIGH (ref 4.0–10.5)
WBC: 8.4 10*3/uL (ref 4.0–10.5)
WBC: 9.4 10*3/uL (ref 4.0–10.5)

## 2010-05-31 LAB — GLUCOSE, CAPILLARY
Glucose-Capillary: 100 mg/dL — ABNORMAL HIGH (ref 70–99)
Glucose-Capillary: 102 mg/dL — ABNORMAL HIGH (ref 70–99)
Glucose-Capillary: 103 mg/dL — ABNORMAL HIGH (ref 70–99)
Glucose-Capillary: 105 mg/dL — ABNORMAL HIGH (ref 70–99)
Glucose-Capillary: 109 mg/dL — ABNORMAL HIGH (ref 70–99)
Glucose-Capillary: 114 mg/dL — ABNORMAL HIGH (ref 70–99)
Glucose-Capillary: 114 mg/dL — ABNORMAL HIGH (ref 70–99)
Glucose-Capillary: 152 mg/dL — ABNORMAL HIGH (ref 70–99)
Glucose-Capillary: 90 mg/dL (ref 70–99)
Glucose-Capillary: 92 mg/dL (ref 70–99)
Glucose-Capillary: 96 mg/dL (ref 70–99)

## 2010-05-31 LAB — HEPARIN LEVEL (UNFRACTIONATED)
Heparin Unfractionated: 0.27 IU/mL — ABNORMAL LOW (ref 0.30–0.70)
Heparin Unfractionated: 0.37 IU/mL (ref 0.30–0.70)
Heparin Unfractionated: 0.4 IU/mL (ref 0.30–0.70)
Heparin Unfractionated: 0.69 IU/mL (ref 0.30–0.70)

## 2010-05-31 LAB — COMPREHENSIVE METABOLIC PANEL
ALT: 11 U/L (ref 0–35)
AST: 20 U/L (ref 0–37)
CO2: 32 mEq/L (ref 19–32)
Calcium: 8.9 mg/dL (ref 8.4–10.5)
GFR calc Af Amer: >60 mL/min (ref >60)
Potassium: 4.1 mEq/L (ref 3.5–5.1)
Sodium: 139 mEq/L (ref 135–145)
Total Protein: 7.2 g/dL (ref 6.0–8.3)

## 2010-05-31 LAB — VANCOMYCIN, TROUGH
Vancomycin Tr: 27.2 ug/mL (ref 10.0–20.0)
Vancomycin Tr: 41.2 ug/mL (ref 10.0–20.0)

## 2010-06-01 LAB — GLUCOSE, CAPILLARY
Glucose-Capillary: 101 mg/dL — ABNORMAL HIGH (ref 70–99)
Glucose-Capillary: 104 mg/dL — ABNORMAL HIGH (ref 70–99)
Glucose-Capillary: 106 mg/dL — ABNORMAL HIGH (ref 70–99)
Glucose-Capillary: 110 mg/dL — ABNORMAL HIGH (ref 70–99)
Glucose-Capillary: 112 mg/dL — ABNORMAL HIGH (ref 70–99)
Glucose-Capillary: 116 mg/dL — ABNORMAL HIGH (ref 70–99)
Glucose-Capillary: 117 mg/dL — ABNORMAL HIGH (ref 70–99)
Glucose-Capillary: 125 mg/dL — ABNORMAL HIGH (ref 70–99)
Glucose-Capillary: 126 mg/dL — ABNORMAL HIGH (ref 70–99)
Glucose-Capillary: 145 mg/dL — ABNORMAL HIGH (ref 70–99)
Glucose-Capillary: 88 mg/dL (ref 70–99)
Glucose-Capillary: 88 mg/dL (ref 70–99)

## 2010-06-01 LAB — BASIC METABOLIC PANEL
BUN: 7 mg/dL (ref 6–23)
BUN: 7 mg/dL (ref 6–23)
BUN: 9 mg/dL (ref 6–23)
CO2: 34 mEq/L — ABNORMAL HIGH (ref 19–32)
CO2: 34 mEq/L — ABNORMAL HIGH (ref 19–32)
CO2: 35 mEq/L — ABNORMAL HIGH (ref 19–32)
Calcium: 8.4 mg/dL (ref 8.4–10.5)
Calcium: 8.6 mg/dL (ref 8.4–10.5)
Chloride: 91 mEq/L — ABNORMAL LOW (ref 96–112)
Chloride: 94 mEq/L — ABNORMAL LOW (ref 96–112)
Chloride: 96 mEq/L (ref 96–112)
Creatinine, Ser: 0.64 mg/dL (ref 0.4–1.2)
Creatinine, Ser: 0.65 mg/dL (ref 0.4–1.2)
Creatinine, Ser: 0.84 mg/dL (ref 0.4–1.2)
GFR calc Af Amer: >60 mL/min (ref >60)
GFR calc non Af Amer: >60 mL/min (ref >60)
Glucose, Bld: 95 mg/dL (ref 70–99)
Glucose, Bld: 99 mg/dL (ref 70–99)
Potassium: 4 mEq/L (ref 3.5–5.1)
Potassium: 4.2 mEq/L (ref 3.5–5.1)
Sodium: 134 mEq/L — ABNORMAL LOW (ref 135–145)

## 2010-06-01 LAB — CBC
HCT: 27.2 % — ABNORMAL LOW (ref 36.0–46.0)
HCT: 27.8 % — ABNORMAL LOW (ref 36.0–46.0)
HCT: 28.3 % — ABNORMAL LOW (ref 36.0–46.0)
Hemoglobin: 9.3 g/dL — ABNORMAL LOW (ref 12.0–15.0)
MCHC: 33 g/dL (ref 30.0–36.0)
MCHC: 33.4 g/dL (ref 30.0–36.0)
MCHC: 34 g/dL (ref 30.0–36.0)
MCHC: 34.3 g/dL (ref 30.0–36.0)
MCV: 87.8 fL (ref 78.0–100.0)
MCV: 88.3 fL (ref 78.0–100.0)
MCV: 88.5 fL (ref 78.0–100.0)
Platelets: 295 10*3/uL (ref 150–400)
Platelets: 398 10*3/uL (ref 150–400)
RBC: 3.07 MIL/uL — ABNORMAL LOW (ref 3.87–5.11)
RBC: 3.21 MIL/uL — ABNORMAL LOW (ref 3.87–5.11)
RBC: 3.27 MIL/uL — ABNORMAL LOW (ref 3.87–5.11)
RDW: 16.5 % — ABNORMAL HIGH (ref 11.5–15.5)
RDW: 17 % — ABNORMAL HIGH (ref 11.5–15.5)
WBC: 10.8 10*3/uL — ABNORMAL HIGH (ref 4.0–10.5)
WBC: 11.2 10*3/uL — ABNORMAL HIGH (ref 4.0–10.5)

## 2010-06-01 LAB — PHOSPHORUS
Phosphorus: 2.9 mg/dL (ref 2.3–4.6)
Phosphorus: 2.9 mg/dL (ref 2.3–4.6)

## 2010-06-01 LAB — DIFFERENTIAL
Basophils Absolute: 0 10*3/uL (ref 0.0–0.1)
Basophils Absolute: 0.1 10*3/uL (ref 0.0–0.1)
Basophils Relative: 0 % (ref 0–1)
Basophils Relative: 0 % (ref 0–1)
Basophils Relative: 0 % (ref 0–1)
Eosinophils Absolute: 0.4 10*3/uL (ref 0.0–0.7)
Eosinophils Absolute: 0.4 10*3/uL (ref 0.0–0.7)
Eosinophils Absolute: 0.4 10*3/uL (ref 0.0–0.7)
Eosinophils Absolute: 0.4 10*3/uL (ref 0.0–0.7)
Eosinophils Relative: 3 % (ref 0–5)
Eosinophils Relative: 3 % (ref 0–5)
Eosinophils Relative: 3 % (ref 0–5)
Eosinophils Relative: 4 % (ref 0–5)
Eosinophils Relative: 5 % (ref 0–5)
Lymphocytes Relative: 14 % (ref 12–46)
Lymphs Abs: 1.3 10*3/uL (ref 0.7–4.0)
Lymphs Abs: 1.5 10*3/uL (ref 0.7–4.0)
Lymphs Abs: 1.6 10*3/uL (ref 0.7–4.0)
Monocytes Absolute: 0.8 10*3/uL (ref 0.1–1.0)
Monocytes Relative: 5 % (ref 3–12)
Monocytes Relative: 7 % (ref 3–12)
Monocytes Relative: 7 % (ref 3–12)
Monocytes Relative: 8 % (ref 3–12)
Neutro Abs: 8 10*3/uL — ABNORMAL HIGH (ref 1.7–7.7)
Neutrophils Relative %: 74 % (ref 43–77)
Neutrophils Relative %: 78 % — ABNORMAL HIGH (ref 43–77)
Neutrophils Relative %: 80 % — ABNORMAL HIGH (ref 43–77)

## 2010-06-01 LAB — COMPREHENSIVE METABOLIC PANEL
ALT: 10 U/L (ref 0–35)
AST: 14 U/L (ref 0–37)
CO2: 36 mEq/L — ABNORMAL HIGH (ref 19–32)
Calcium: 8.4 mg/dL (ref 8.4–10.5)
GFR calc Af Amer: >60 mL/min (ref >60)
GFR calc non Af Amer: 56 mL/min — ABNORMAL LOW (ref >60)
Sodium: 139 mEq/L (ref 135–145)
Total Protein: 6.8 g/dL (ref 6.0–8.3)

## 2010-06-01 LAB — MAGNESIUM
Magnesium: 1.7 mg/dL (ref 1.5–2.5)
Magnesium: 2.1 mg/dL (ref 1.5–2.5)

## 2010-06-01 LAB — SEDIMENTATION RATE: Sed Rate: 125 mm/hr — ABNORMAL HIGH (ref 0–22)

## 2010-06-05 LAB — COMPREHENSIVE METABOLIC PANEL
ALT: 11 U/L (ref 0–35)
AST: 19 U/L (ref 0–37)
Alkaline Phosphatase: 72 U/L (ref 39–117)
CO2: 31 mEq/L (ref 19–32)
Chloride: 102 mEq/L (ref 96–112)
GFR calc Af Amer: >60 mL/min (ref >60)
GFR calc non Af Amer: 52 mL/min — ABNORMAL LOW (ref >60)
Potassium: 4 mEq/L (ref 3.5–5.1)
Sodium: 139 mEq/L (ref 135–145)
Total Bilirubin: 0.5 mg/dL (ref 0.3–1.2)

## 2010-06-05 LAB — DIFFERENTIAL
Basophils Absolute: 0.1 10*3/uL (ref 0.0–0.1)
Basophils Absolute: 0.1 10*3/uL (ref 0.0–0.1)
Basophils Relative: 1 % (ref 0–1)
Eosinophils Absolute: 0.8 10*3/uL — ABNORMAL HIGH (ref 0.0–0.7)
Eosinophils Absolute: 0.9 10*3/uL — ABNORMAL HIGH (ref 0.0–0.7)
Eosinophils Relative: 6 % — ABNORMAL HIGH (ref 0–5)
Eosinophils Relative: 7 % — ABNORMAL HIGH (ref 0–5)
Lymphs Abs: 2.1 10*3/uL (ref 0.7–4.0)

## 2010-06-05 LAB — TISSUE CULTURE

## 2010-06-05 LAB — CBC
HCT: 31.6 % — ABNORMAL LOW (ref 36.0–46.0)
Hemoglobin: 10.5 g/dL — ABNORMAL LOW (ref 12.0–15.0)
MCHC: 33.7 g/dL (ref 30.0–36.0)
MCV: 89.1 fL (ref 78.0–100.0)
Platelets: 501 10*3/uL — ABNORMAL HIGH (ref 150–400)
RBC: 3.58 MIL/uL — ABNORMAL LOW (ref 3.87–5.11)
RDW: 17.4 % — ABNORMAL HIGH (ref 11.5–15.5)
WBC: 13.6 10*3/uL — ABNORMAL HIGH (ref 4.0–10.5)

## 2010-06-05 LAB — GLUCOSE, CAPILLARY
Glucose-Capillary: 100 mg/dL — ABNORMAL HIGH (ref 70–99)
Glucose-Capillary: 114 mg/dL — ABNORMAL HIGH (ref 70–99)
Glucose-Capillary: 119 mg/dL — ABNORMAL HIGH (ref 70–99)
Glucose-Capillary: 99 mg/dL (ref 70–99)

## 2010-06-05 LAB — WOUND CULTURE: Gram Stain: NONE SEEN

## 2010-06-05 LAB — URINE MICROSCOPIC-ADD ON

## 2010-06-05 LAB — BASIC METABOLIC PANEL
BUN: 7 mg/dL (ref 6–23)
Chloride: 106 mEq/L (ref 96–112)
Glucose, Bld: 97 mg/dL (ref 70–99)
Potassium: 4.5 mEq/L (ref 3.5–5.1)

## 2010-06-05 LAB — URINALYSIS, ROUTINE W REFLEX MICROSCOPIC
Bilirubin Urine: NEGATIVE
Ketones, ur: NEGATIVE mg/dL
Specific Gravity, Urine: 1.025 (ref 1.005–1.030)
pH: 6 (ref 5.0–8.0)

## 2010-06-05 LAB — SEDIMENTATION RATE: Sed Rate: 108 mm/hr — ABNORMAL HIGH (ref 0–22)

## 2010-06-05 LAB — CULTURE, BLOOD (ROUTINE X 2)
Culture: NO GROWTH
Report Status: 4032011
Report Status: 4032011

## 2010-06-15 ENCOUNTER — Inpatient Hospital Stay (HOSPITAL_COMMUNITY)
Admission: RE | Admit: 2010-06-15 | Discharge: 2010-06-16 | DRG: 036 | Disposition: A | Discharge status: Home / Self Care | Payer: Medicare HMO | Source: Ambulatory Visit | Attending: Surgery | Admitting: Surgery

## 2010-06-15 DIAGNOSIS — J4489 Other specified chronic obstructive pulmonary disease: Secondary | ICD-10-CM | POA: Diagnosis present

## 2010-06-15 DIAGNOSIS — I10 Essential (primary) hypertension: Secondary | ICD-10-CM | POA: Diagnosis present

## 2010-06-15 DIAGNOSIS — L408 Other psoriasis: Secondary | ICD-10-CM | POA: Diagnosis present

## 2010-06-15 DIAGNOSIS — Z86718 Personal history of other venous thrombosis and embolism: Secondary | ICD-10-CM

## 2010-06-15 DIAGNOSIS — I6529 Occlusion and stenosis of unspecified carotid artery: Principal | ICD-10-CM | POA: Diagnosis present

## 2010-06-15 DIAGNOSIS — E669 Obesity, unspecified: Secondary | ICD-10-CM | POA: Diagnosis present

## 2010-06-15 DIAGNOSIS — J449 Chronic obstructive pulmonary disease, unspecified: Secondary | ICD-10-CM | POA: Diagnosis present

## 2010-06-15 LAB — POCT ACTIVATED CLOTTING TIME: Activated Clotting Time: 346 seconds

## 2010-06-15 LAB — POCT I-STAT, CHEM 8
BUN: 18 mg/dL (ref 6–23)
HCT: 38 % (ref 36.0–46.0)
Hemoglobin: 12.9 g/dL (ref 12.0–15.0)
Sodium: 137 mEq/L (ref 135–145)
TCO2: 25 mmol/L (ref 0–100)

## 2010-06-15 LAB — GLUCOSE, CAPILLARY: Glucose-Capillary: 81 mg/dL (ref 70–99)

## 2010-06-16 LAB — CBC
HCT: 30.2 % — ABNORMAL LOW (ref 36.0–46.0)
MCH: 27.5 pg (ref 26.0–34.0)
MCV: 88.3 fL (ref 78.0–100.0)
Platelets: 444 10*3/uL — ABNORMAL HIGH (ref 150–400)
RDW: 15.4 % (ref 11.5–15.5)

## 2010-06-16 LAB — BASIC METABOLIC PANEL
BUN: 10 mg/dL (ref 6–23)
Creatinine, Ser: 0.88 mg/dL (ref 0.4–1.2)
GFR calc non Af Amer: >60 mL/min (ref >60)
Glucose, Bld: 78 mg/dL (ref 70–99)

## 2010-06-17 LAB — GLUCOSE, CAPILLARY: Glucose-Capillary: 74 mg/dL (ref 70–99)

## 2010-06-22 NOTE — Consult Note (Signed)
  NAMEMYLEAH, Jill Robinson NO.:  [id number]  MEDICAL RECORD NO.:  [id number]           PATIENT TYPE:  LOCATION:                                 FACILITY:  PHYSICIAN:  Ammara Raj K. Lizzett Nobile, M.D.DATE OF BIRTH:  [birth year]  DATE OF CONSULTATION: DATE OF DISCHARGE:                                CONSULTATION   CLINICAL HISTORY:  The patient has  symptomatic left internal carotid artery stenosis proximally.  EXAMINATION:  Left-sided carotid arteriograms before and after stent placement.  FINDINGS:  Left common carotid arteriogram demonstrates  a tight focal stenosis of the left internal carotid artery with antegrade flow noted to the cranial skull base.  There is normal opacification of the distal cervical, the petrous, cavernous, supraclinoid,  left ICA.  There is filling of the left middle cerebral artery proximally and subsequently the trifurcation branches.  Unopacified blood is seen in the left middle cerebral artery M1 segment, probably related to inflow via  the anterior communicating artery from the right-sided  ICA . There is no opacification of the left anterior cerebral artery.  The post stent placement arteriogram demonstrates no change in the caliber of the distal left internal carotid artery in the vertical segment.  The petrous, cavernous, and supraclinoid segments remain   normal. There is filling of the left anterior cerebral artery with brisker flow through the left middle cerebral artery distribution .  Significant spasm of the left internal carotid artery in its mid cervical portion just distal to the stent is  noted.  IMPRESSION: 1. Improved antegrade flow in the left internal carotid artery post     stent placement at the cranial skull base.  Improved flow of blood     noted in the left middle and the left anterior cerebral arteries     post stent placement. 2. No angiographic evidence noted of intraluminal filling defects.      ______________________________ Grandville Silos. Corliss Skains, M.D.     SKD/MEDQ  D:  06/20/2010  T:  06/21/2010  Job:  [id number]  Electronically Signed by Julieanne Cotton M.D. on 06/22/2010 03:33:34 PM

## 2010-07-04 ENCOUNTER — Other Ambulatory Visit (INDEPENDENT_AMBULATORY_CARE_PROVIDER_SITE_OTHER): Payer: Medicare HMO

## 2010-07-04 ENCOUNTER — Ambulatory Visit (INDEPENDENT_AMBULATORY_CARE_PROVIDER_SITE_OTHER): Payer: Medicare HMO | Admitting: Surgery

## 2010-07-04 DIAGNOSIS — I6529 Occlusion and stenosis of unspecified carotid artery: Secondary | ICD-10-CM

## 2010-07-04 DIAGNOSIS — Z48812 Encounter for surgical aftercare following surgery on the circulatory system: Secondary | ICD-10-CM

## 2010-07-05 NOTE — Assessment & Plan Note (Signed)
OFFICE VISIT  ORIAN, AMBERG ANN C DOB:  29-Mar-1940                                       07/04/2010 [id number]#:[id number]  REASON FOR VISIT:  Follow up carotid stent.  This is a 70 year old female who had a right brain stroke in late December.  Due to her skin condition with her psoriasis she was deemed not to be a candidate for carotid endarterectomy.  She underwent angiography which showed that the stenosis on the right side was over estimated.  However, it did confirm a >90% stenosis on the left.  She has subsequently undergone left carotid stenting with distal protection approximately 1 month ago.  She has done very well.  She has not had any sequelae or complications from her operation.  She denies numbness or weakness, slurred speech, amaurosis fugax.  PHYSICAL EXAMINATION:  Vital signs:  Heart rate 88, blood pressure 114/78, temperature 97.8.  General:  She is well-appearing, in no distress.  Respirations:  Nonlabored.  Cardiovascular:  Carotid arteries are without bruit.  Extremities:  Warm and well-perfused.  Skin: Psoriatic condition has dramatically improved.  DIAGNOSTIC STUDIES:  She has a patent left ICA with stent in place with external carotid stenosis.  The right side is 40% to 59%.  ASSESSMENT AND PLAN:  The patient is doing very well at this time.  She will be placed on our surveillance protocol.  I will see her back in 6 months with a carotid ultrasound.  Hopefully, she will be able to continue to afford her Plavix as she cannot tolerate aspirin.  We did discuss this today.  I told her that I really wanted to keep her on Plavix for the time being.    Jorge Ny, MD Electronically Signed  VWB/MEDQ  D:  07/04/2010  T:  07/05/2010  Job:  [id number]

## 2010-07-19 NOTE — Procedures (Unsigned)
CAROTID DUPLEX EXAM  INDICATION:  Followup left stent placement.  HISTORY: Diabetes:  Yes. Cardiac:  No. Hypertension:  No. Smoking:  Previous. Previous Surgery:  Left internal carotid artery stent placement. CV History:  Currently asymptomatic. Amaurosis Fugax No, Paresthesias No, Hemiparesis No                                      RIGHT             LEFT Brachial systolic pressure:         133               135 Brachial Doppler waveforms:         Normal            Normal Vertebral direction of flow:        Antegrade         Antegrade DUPLEX VELOCITIES (cm/sec) CCA peak systolic                   95                58 ECA peak systolic                   102               175 ICA peak systolic                   158               71 ICA end diastolic                   49                25 PLAQUE MORPHOLOGY:                  Mixed             Mixed PLAQUE AMOUNT:                      Moderate          Minimal PLAQUE LOCATION:                    ICA               ICA, ECA, CCA  IMPRESSION: 1. Right internal carotid artery velocity suggestive of 40%-59%     stenosis. 2. Patent left internal carotid artery with stent placement. 3. Left external carotid artery stenosis. 4. Antegrade vertebral arteries bilaterally.  ___________________________________________ V. Charlena Cross, MD  EM/MEDQ  D:  07/04/2010  T:  07/04/2010  Job:  [id number]

## 2010-07-25 NOTE — Op Note (Signed)
NAMEVASTIE, DOUTY NO.:  [id number]  MEDICAL RECORD NO.:  [id number]           PATIENT TYPE:  I  LOCATION:  3308                         FACILITY:  [facility]  PHYSICIAN:  Juleen China IV, MDDATE OF BIRTH:  1940/07/29  DATE OF PROCEDURE:  06/22/2010 DATE OF DISCHARGE:  06/16/2010                              OPERATIVE REPORT   PREOPERATIVE DIAGNOSIS:  Asymptomatic left carotid stenosis.  POSTOPERATIVE DIAGNOSIS:  Asymptomatic left carotid stenosis.  PROCEDURES PERFORMED: 1. Ultrasound access right femoral artery. 2. Left common and left carotid stent with distal embolic protection.  SURGEON: 1. Charlena Cross, MD  CO-SURGEON:  Nanetta Batty, MD  DEVICES USED:  XACT 10 x 8 stent with large filter.  INDICATIONS:  Jill Robinson is a 70 year old female with extensive total body psoriasis who is not a operative candidate due to her skin condition. She has previously had diagnostic imaging, which reveals a high-grade left carotid stenosis.  She comes in today for carotid stenting.  PROCEDURE:  The patient was identified in the holding area and taken to room 8 and placed supine on the table.  The right groin was prepped and draped in usual fashion.  A time-out was called.  The right femoral artery was accessed, was evaluated by ultrasound, and found to be widely patent.  It was accessed under ultrasound guidance with a micropuncture needle.  An 0.18 wire was then advanced without resistance.  The micropuncture sheath was placed.  A Bentson wire was then advanced into the aorta and a 6-French sheath was placed.  Next, using a JP-1 catheter, the left carotid artery was cannulated.  Contrast injection was performed to confirm successful cannulation of the artery.  At this point in time, the patient was started on Angiomax drip.  Once the ACT was confirmed, an Amplatz Super-Stiff wire was advanced into the catheter and the sheath was then advanced over the  catheter into the left common carotid artery.  A large filter was then placed and was prepared on the back table and then it was positioned into a straight portion of the left internal carotid distal to the stenosis.  The filter was then deployed.  Next a 3 x 2 balloon was used to perform predilation on the stenosis.  We then prepared a XACT tapered 10 x 8 stent and advanced it through the sheath.  After confirming the stent was in the appropriate location, the stent was deployed beginning in the internal carotid artery back into the common carotid artery.  The stent was then molded to confirmation with a 5 x 5 balloon.  The patient was given prophylactic 0.5 mg of atropine.  She remained hemodynamically stable.  Prior to the intervention, we performed intracranial images to serve as a baseline.  Completion angiography was then performed once the stent was fully deployed.  This shows a widely patent stent.  There is slight luminal narrowing in the distal aspect of the stent where the energy from the tortuosity of the internal carotid artery created a relative kink.  At this point in time, we did not feel like this warranted  intervention, but will need to be monitored over time.  Intracranial images was then performed after the procedure, which showed no change from her initial study, in fact there was better filling of the anterior circulation after the procedure.  At this point in time, the filter was retrieved.  The sheath was pulled back into the groin.  The patient remained neurologically intact.  She tolerated the procedure well.  IMPRESSION:  Successful stenting of a high-grade left internal carotid stenosis using a 10 x 8 XACT stent with large filter.     Jill Ny, MD     VWB/MEDQ  D:  06/22/2010  T:  06/23/2010  Job:  [id number]  Electronically Signed by Arelia Longest IV MD on 07/25/2010 10:53:16 PM

## 2010-07-26 NOTE — Assessment & Plan Note (Signed)
OFFICE VISIT   Jill Robinson, Jill Robinson  DOB:  08/17/1940                                       03/21/2010  ZOXWR#:[id number]   REASON FOR VISIT:  Followup right hemispheric stroke.   HISTORY:  This is a 70 year old female, who presented from [facility],  to [facility] having had a right hemispheric stroke.  Her symptoms  were difficulty putting words together and abnormal writing.  Imaging  studies revealed greater than 70% stenosis on the right.  MRI showed  patchy acute infarct in the right MCA distribution.  The patient has had  progressive improvement in her language with skills to where she is  nearly back to her baseline.   The patient suffers from severe psoriasis affecting nearly her total  body   The patient is medically managed for her hypertension, diabetes, and  hypercholesterolemia.  She also has an ulceration on the medial aspect  of her right leg.   PAST MEDICAL HISTORY:  1. Right hemispheric stroke.  2. Right foot cellulitis with MRSA infection and open wound.  3. Extensive psoriasis.  4. Diabetes.  5. Hypertension.  6. COPD.  7. History of DVT.  8. Obesity,   SOCIAL HISTORY:  She smokes a pack a day.  No alcohol.   REVIEW OF SYSTEMS:  As described above.   PHYSICAL EXAMINATION:  VITAL SIGNS:  Heart rate 80, blood pressure  111/68, temperature is 97.4.  GENERAL:  Well-appearing, in no distress.  HEENT:  Within normal limits.  LUNGS:  Clear bilaterally.  CARDIOVASCULAR:  Regular rate and rhythm.  ABDOMEN:  Obese.  MUSCULOSKELETAL:  Medial displacement the right ankle with skin  ulceration and surrounding drainage.  SKIN:  Shows some diffuse extensive psoriatic changes.  NEUROLOGICALLY:  Motor strength is nearly improved.  Her verbal skills  are improved.   Diagnostic studies:  CT angiogram was performed today.  This shows  subacute infarcts on the right with mild to moderate left internal  carotid narrowing, 95% stenosis of  the right carotid artery   ASSESSMENT/PLAN:  Symptomatic right carotid stenosis.   PLAN:  I discussed with the patient the benefits of proceeding with  carotid revascularization.  I told her that since her skin psoriasis is  so extensive, she would probably be a better candidate for percutaneous  intervention.  We discussed the risks of stroke and bleeding, proceeding  with stent as well as possibility that it may not be successful  secondary to her anatomy.  She understands all these and I have  scheduled her to see Dr. Allyson Sabal tomorrow for a Myoview.  I would like to  proceed with her carotid stenting within the next 2 weeks.  She is  already on Plavix.  She has an intolerance to aspirin.     Jorge Ny, MD  Electronically Signed   VWB/MEDQ  D:  03/21/2010  T:  03/22/2010  Job:  [id number]   cc:   Nanetta Batty, M.D.

## 2010-07-29 NOTE — Consult Note (Signed)
NAMEADRIA, COSTLEY NO.:  [id number]   MEDICAL RECORD NO.:  [id number]          PATIENT TYPE:  INP   LOCATION:  A309                          FACILITY:  [facility]   PHYSICIAN:  Barbaraann Barthel, M.D. DATE OF BIRTH:  11/20/1940   DATE OF CONSULTATION:  01/25/2006  DATE OF DISCHARGE:                                   CONSULTATION   This is a 70 year old white female who was admitted to the hospital with  cellulitis of the right foot.  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  She had at least a  week of swelling and erythema and weeping from this area.  She was admitted  to the medical service where she has been treated on antibiotics.  An MRI  shows no sign of any osteomyelitis.  Surgery was asked to evaluate.   The patient was examined and the history and physical was reviewed.  She has  some what appears to be chronic stasis problems of both feet and she also  has areas of psoriasis on her lower extremities as well.  She has a previous  surgical scar radially stemming from her medial malleolus and there is  little fissure and this area.  There is no abscess appreciated clinically  and no crepitance.  The patient is a smoker, however, she is not a diabetic.   I checked her dressing, she complained bitterly when I very lightly touched  the area around the medial malleolus and I think at present that I certainly  see no reason for any incision or drainage or any area that needs to be  debrided.  I would treat this with elevation and Silvadene dressings b.i.d.  to this area.  This is been explained to the nursing staff.  I would also  consider obtaining an orthopedic input.  I will this patient as needed in  the hospital.      Barbaraann Barthel, M.D.  Electronically Signed     WB/MEDQ  D:  01/25/2006  T:  01/25/2006  Job:   [id number]   cc:   [facility]

## 2010-07-29 NOTE — H&P (Signed)
NAMESHATISHA, FALTER NO.:  [id number]   MEDICAL RECORD NO.:  [id number]          PATIENT TYPE:  INP   LOCATION:  A309                          FACILITY:  [facility]   PHYSICIAN:  Mobolaji B. Bakare, M.D.DATE OF BIRTH:  [birth year]   DATE OF ADMISSION:  01/22/2006  DATE OF DISCHARGE:  LH                              HISTORY & PHYSICAL   PRIMARY CARE PHYSICIAN:  Unassigned.   CHIEF COMPLAINT:  Right foot swelling and redness for five days.   HISTORY OF PRESENT ILLNESS:  Ms. Blackson is a 70 year old Caucasian  female who has history of right foot problems.  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  She had a  surgery at the age of 9 years which did not correct this deformity and  she has been living with the deformed right foot.  She has chronic  hyperpigmentation of the distal third of right lower extremity and she  started noticing swelling and redness in the last five days.  This was  accompanied by some discharge and pain.  She denies any systemic  symptoms like fever, chills, nausea, vomiting or diarrhea.  The patient  decided to come to the emergency room to seek help.  She does not have  primary care physician and not on any known medications.   The patient had an x-ray in the emergency room of the right foot which  did not suggest osteomyelitis.   REVIEW OF SYSTEMS:  She has cough which has been ongoing for about two  weeks.  The cough is unproductive of sputum.  The patient is a lifelong  smoker.  She has also been experiencing some wheezing. There is no  orthopnea or PND.  There is no abdominal pain, chest pain.  Rest of the  review of systems as in the HPI.  She has itching from psoriatic lesion.   PAST MEDICAL HISTORY:  The patient has history of psoriasis.  She has  not been receiving any treatment for this.   PAST SURGICAL HISTORY:  Right leg surgery at the age of 28.   FAMILY HISTORY:  Noncontributory.   SOCIAL HISTORY:  The patient is a lifelong smoker.  She has been smoking  for 21 years.  She is averaging one pack per day.  Now she does not  smoke cigarettes or drink alcohol.  She works as a Nature conservation officer.   MEDICATIONS:  None.   ALLERGIES:  She is intolerant to ASPIRIN.   VITALS:  Temperature 96.9, blood pressure 153/79, pulse of 77,  respiratory rate of 20, O2 sats pending.  On examination, the patient is  awake, alert and oriented in time, place and person.  Normocephalic and  atraumatic head.  Pupils are equal, round, and reactive to light.  Extraocular movements intact.  Mucus membranes moist.  No oral thrush.  No elevated JVD.  No thyromegaly.  LUNGS:  Bilateral expiratory rhonchi with reduced air entry bilaterally.  The patient is obese and not in respiratory distress.  CV:  S1-S2 regular.  No murmur.  ABDOMEN:  Obese, soft and nontender.  Bowel sounds present.  EXTREMITIES:  Right lower extremity has deformity of the right foot with  valgum deformation.  She has an area of circumscribed erythema, edema  and tenderness over the medial malleolus with an area of open wound and  this is very weeping.  Dorsalis pedis pulses are palpable.  SKIN:  She has extensive psoriatic lesion on the whole body but clearly  in the posterior trunk over the lower back and buttock.  There is an  extensive plaque.  CNS:  No focal neurological deficit.   INITIAL LABORATORY DATA:  White cells 10.7, hemoglobin 13.3, hematocrit  39.4, platelets 439.  Neutrophils 66, lymphocytes 22%.  Sodium 137,  potassium 4.9, chloride 103, CO2 30, glucose 109, BUN 18, creatinine  1.5, calcium 9.1.  X-ray of the right foot shows osteoporosis with intertarsal degenerative  changes.  No definite acute bony abnormality.  Extensive soft tissue  swelling.  X-ray right ankle shows osteoporosis with significant soft  tissue swelling and question sequela of prior distal fibula fracture.  Abnormal  appearance of [place]cal[place] joints with probable deformity from  lateral subluxation of calcaneus.  Consider follow-up MRI to better  assess for extent of soft tissue potentially bony abnormality.   ASSESSMENT/PLAN:  1. Cellulitis right foot:  The patient has an underlying deformity of      the right foot.  In addition, she has psoriasis all over and      probably chronic lower extremity venous stasis per history.  She      will be admitted for IV vancomycin and Zosyn.  Blood cultures and      wound cultures have been taken.  We will rule out      osteomyelitis/deep seated abscess with a MRI of the right foot.      Elevate the right foot and obtain wound care consult for      evaluation.  2. Psoriasis:  The patient apparently has not sought medical treatment      in a long time.  We will start Dovonex cream b.i.d.  3. Acute bronchitis possibly underlying chronic obstructive pulmonary      disease:  The patient will be started on albuterol and Atrovent and      she will be covered with antibiotics as mentioned above.  We will      obtain chest x-ray to rule out pneumonia and possibly document      chronic obstructive pulmonary disease changes.  4. Tobacco abuse:  The patient does not want nicotine patch and she      does not want flu shots either.  We will offer tobacco cessation      counseling.  5. Obesity:  Check fasting lipid profile, TSH and hemoglobin A1c.  We      offer weight loss counseling as well.  6. The patient will need a physician in the community upon discharge.      Mobolaji B. Corky Downs, M.D.  Electronically Signed     MBB/MEDQ  D:  01/22/2006  T:  01/22/2006  Job:  [id number]

## 2010-07-29 NOTE — Procedures (Signed)
NAMERONALD, VINSANT NO.:  [id number]   MEDICAL RECORD NO.:  [id number]          PATIENT TYPE:  OUT   LOCATION:  RESP                          FACILITY:  [facility]   PHYSICIAN:  Edward L. Juanetta Gosling, M.D.DATE OF BIRTH:  [birth year]   DATE OF PROCEDURE:  DATE OF DISCHARGE:  07/04/2007                            PULMONARY FUNCTION TEST   1. Spirometry shows a moderate-to-severe ventilatory defect with      evidence of airflow obstruction, most marked in the smaller      airways.  2. Lung volumes show no evidence of restrictive change, but do show      air trapping.  3. DLCO was mildly reduced.  4. Arterial blood gases show relative resting hypoxemia and normal      pCO2.  5. There is no significant bronchodilator response.      Edward L. Juanetta Gosling, M.D.  Electronically Signed     ELH/MEDQ  D:  07/06/2007  T:  07/06/2007  Job:  [id number]   cc:   Erle Crocker, M.D.

## 2010-07-29 NOTE — Discharge Summary (Signed)
NAMEHAANI, BAKULA NO.:  [id number]   MEDICAL RECORD NO.:  [id number]          PATIENT TYPE:  INP   LOCATION:  A309                          FACILITY:  [facility]   PHYSICIAN:  Catalina Pizza, M.D.        DATE OF BIRTH:  1940/10/17   DATE OF ADMISSION:  01/22/2006  DATE OF DISCHARGE:  11/18/2007LH                               DISCHARGE SUMMARY   DISCHARGE DIAGNOSES:  1. Cellulitis of the right foot with group C Streptococcus.  2. Mild chronic obstructive pulmonary disease exacerbation.  3. Psoriasis.  4. Mild hyperkalemia.   BRIEF HOSPITAL COURSE:  Ms. Jill Robinson is a 70 year old white female who  was admitted with cellulitis of the right foot.  She did have evaluation  by Dr. Malvin Johns, as well as Dr. Romeo Apple, about treatment of this  chronic wound to her leg.  Apparently, the patient, prior to me seeing  her, left AMA without signing anything.  Care Management attempted to  set up home health care for her, but since she left AMA, this was unable  to be done.  The patient did not have any specific doctor that she had  seen before and will need to be seen again for this significant  cellulitis in her right foot.  They did some routine dressing change  while she was in the hospital, as well as covered with antibiotics,  initially with vancomycin and Zosyn, but then changed over to Rocephin.  Since the patient left AMA, she not receive any further antibiotics at  this time.  Also, for her breathing, she was on routine treatment for  acute bronchitis on COPD type exacerbation and was having some  improvement from this during hospitalization.  She continued to ask for  smoking and had a nicotine patch initially.  As mentioned above, the  patient left AMA, and it is unclear what happened after she left.      Catalina Pizza, M.D.  Electronically Signed     ZH/MEDQ  D:  03/28/2006  T:  03/29/2006  Job:  [id number]

## 2010-07-29 NOTE — Consult Note (Signed)
Jill Robinson, LEASON NO.:  [id number]   MEDICAL RECORD NO.:  [id number]          PATIENT TYPE:  INP   LOCATION:  A309                          FACILITY:  [facility]   PHYSICIAN:  Vickki Hearing, M.D.DATE OF BIRTH:  [birth year]   DATE OF CONSULTATION:  DATE OF DISCHARGE:                                   CONSULTATION   REFERRING PHYSICIAN:  Sorin Lavera Guise, M.D.   CHIEF COMPLAINT:  Wound right foot with valgus deformity.   The history and physical are taken from the patient and the medical record  as dictated by Dr. Corky Downs and consult reviewed as dictated by Dr. Malvin Johns.  From an orthopedic standpoint, this patient had a foot deformity at birth.  Surgical correction was attempted.  It was apparently unsuccessful.  She  presented with hyperpigmentation which is chronic on the distal portion of  her right lower extremity with associated swelling, redness, some drainage  and she was admitted for evaluation to rule out osteomyelitis.  She had  radiographs and MRI which have essentially rule that out.   Review of systems, past family, social history, medications, allergies, etc.  are recorded in the medical record incorporated by reference.  Examination  of this foot shows that there is indeed hyperpigmentation almost in a  stocking distribution without the foot portion of the stocking.  It is  apparently just from the medial malleoli up maybe a third of the tibia.  She  has psoriatic arthritis as well and the skin changes are noted.  There is  hyperemia here and there is a wound on the medial side which appears to be  superficial at this point.  There is a valgus deformity of the foot.  It is  fixed.  The patient is somewhat obese.  She was awake, alert and oriented  and sensory exam to the foot remains intact.  She does have intact pulses.  This is unilateral in terms of skin changes (psoriatic changes of course are  throughout the body).   Again, I have reviewed the  x-rays, the MRI.  There is no evidence of  osteomyelitis and therefore no orthopedic follow-up is necessary.  The foot  deformity is chronic.  It is not the cause of the wound.  So no bracing is  needed.  The flat foot is most likely related to the posterior tibial tendon  and/or bony deformity.      Vickki Hearing, M.D.  Electronically Signed     SEH/MEDQ  D:  01/27/2006  T:  01/27/2006  Job:  [id number]

## 2010-08-11 NOTE — Discharge Summary (Addendum)
  NAMEFLORESTINE, CARMICAL NO.:  [id number]  MEDICAL RECORD NO.:  [id number]           PATIENT TYPE:  LOCATION:                                 FACILITY:  PHYSICIAN:  Juleen China IV, MDDATE OF BIRTH:  [birth year]  DATE OF ADMISSION:  04/07/2010 DATE OF DISCHARGE:                              DISCHARGE SUMMARY   Admission was on April 07, 2010, and the patient went home the same day.  HISTORY:  She is 70 year old.  She had a stroke with right hemispheric stroke with 70% carotid stenosis, and she was admitted.  She had angiogram on April 05, 2010, and was discharged home the same day.  PAST MEDICAL HISTORY:  Significant for right hemispheric stroke, MRSA infection in the right foot, diabetes, hypertension, COPD, and DVT.  The patient will follow up with Dr. Myra Gianotti.  FINAL DIAGNOSES:  Right carotid stenosis, status post right hemispheric stroke.  DISCHARGE MEDICATIONS:  Unchanged as this is an outpatient procedure. She had use of her nebulizers, Plavix 75 mg, ferrous sulfate 325 daily, furosemide 40 mg daily, potassium chloride 20 mEq daily.     Della Goo, PA-C   ______________________________ Seth Bake. Charlena Cross, MD    RR/MEDQ  D:  08/06/2010  T:  08/06/2010  Job:  [id number]  Electronically Signed by Della Goo PA on 08/11/2010 04:06:13 PM Electronically Signed by Arelia Longest IV MD on 08/17/2010 01:27:24 PM

## 2010-11-17 ENCOUNTER — Encounter: Payer: Self-pay | Admitting: Surgery

## 2010-12-06 LAB — BLOOD GAS, ARTERIAL
Acid-base deficit: 0.3
Bicarbonate: 24.1 — ABNORMAL HIGH
O2 Saturation: 92.9
pCO2 arterial: 41.7
pO2, Arterial: 64.5 — ABNORMAL LOW

## 2010-12-08 LAB — BASIC METABOLIC PANEL
BUN: 26 — ABNORMAL HIGH
Chloride: 99
GFR calc non Af Amer: 37 — ABNORMAL LOW
Potassium: 3.8
Sodium: 139

## 2010-12-08 LAB — HEMOGLOBIN AND HEMATOCRIT, BLOOD: Hemoglobin: 13

## 2010-12-09 LAB — HEMOGLOBIN AND HEMATOCRIT, BLOOD
HCT: 35.7 — ABNORMAL LOW
Hemoglobin: 11.9 — ABNORMAL LOW

## 2010-12-09 LAB — BASIC METABOLIC PANEL
Calcium: 9
GFR calc Af Amer: 48 — ABNORMAL LOW
GFR calc non Af Amer: 40 — ABNORMAL LOW
Glucose, Bld: 111 — ABNORMAL HIGH
Potassium: 4.3
Sodium: 139

## 2011-01-02 ENCOUNTER — Ambulatory Visit: Payer: Medicare HMO | Admitting: Surgery

## 2011-01-02 ENCOUNTER — Other Ambulatory Visit (INDEPENDENT_AMBULATORY_CARE_PROVIDER_SITE_OTHER): Payer: Medicare HMO | Admitting: *Deleted

## 2011-01-02 DIAGNOSIS — Z48812 Encounter for surgical aftercare following surgery on the circulatory system: Secondary | ICD-10-CM

## 2011-01-02 DIAGNOSIS — I6529 Occlusion and stenosis of unspecified carotid artery: Secondary | ICD-10-CM

## 2011-01-23 ENCOUNTER — Encounter: Payer: Self-pay | Admitting: Surgery

## 2011-01-23 NOTE — Procedures (Unsigned)
CAROTID DUPLEX EXAM  INDICATION:  Follow up stent placement.  HISTORY: Diabetes:  Yes. Cardiac:  No. Hypertension:  No. Smoking:  Previous. Previous Surgery:  Left ICA stent. CV History:  Asymptomatic. Amaurosis Fugax No, Paresthesias No, Hemiparesis No.                                      RIGHT             LEFT Brachial systolic pressure:         138               135 Brachial Doppler waveforms:         Normal            Normal Vertebral direction of flow:        Antegrade         Antegrade DUPLEX VELOCITIES (cm/sec) CCA peak systolic                   98                87 ECA peak systolic                   95                246 ICA peak systolic                   126               105 ICA end diastolic                   33                28 PLAQUE MORPHOLOGY:                  Mixed             Mixed PLAQUE AMOUNT:                      Minimal           Minimal PLAQUE LOCATION:                    ICA               ECA  IMPRESSION: 1. Right internal carotid artery velocities suggest 1% to 39%     stenosis. 2. Patent left internal carotid artery with stent placement. 3. Left external carotid artery stenosis. 4. Antegrade vertebral arteries bilaterally.  ___________________________________________ V. Charlena Cross, MD  EM/MEDQ  D:  01/02/2011  T:  01/02/2011  Job:  [id number]

## 2011-01-25 ENCOUNTER — Other Ambulatory Visit: Payer: Self-pay | Admitting: Vascular Surgery

## 2011-01-25 DIAGNOSIS — I6529 Occlusion and stenosis of unspecified carotid artery: Secondary | ICD-10-CM

## 2011-01-25 DIAGNOSIS — Z48812 Encounter for surgical aftercare following surgery on the circulatory system: Secondary | ICD-10-CM

## 2011-08-06 IMAGING — CR DG CHEST 1V PORT
1 series · 1 of 1 positions shown · non-contrast
Comparison: [DATE]

CLINICAL DATA: Pulmonary edema, cellulitis

PORTABLE CHEST - 1 VIEW

[view not recorded]
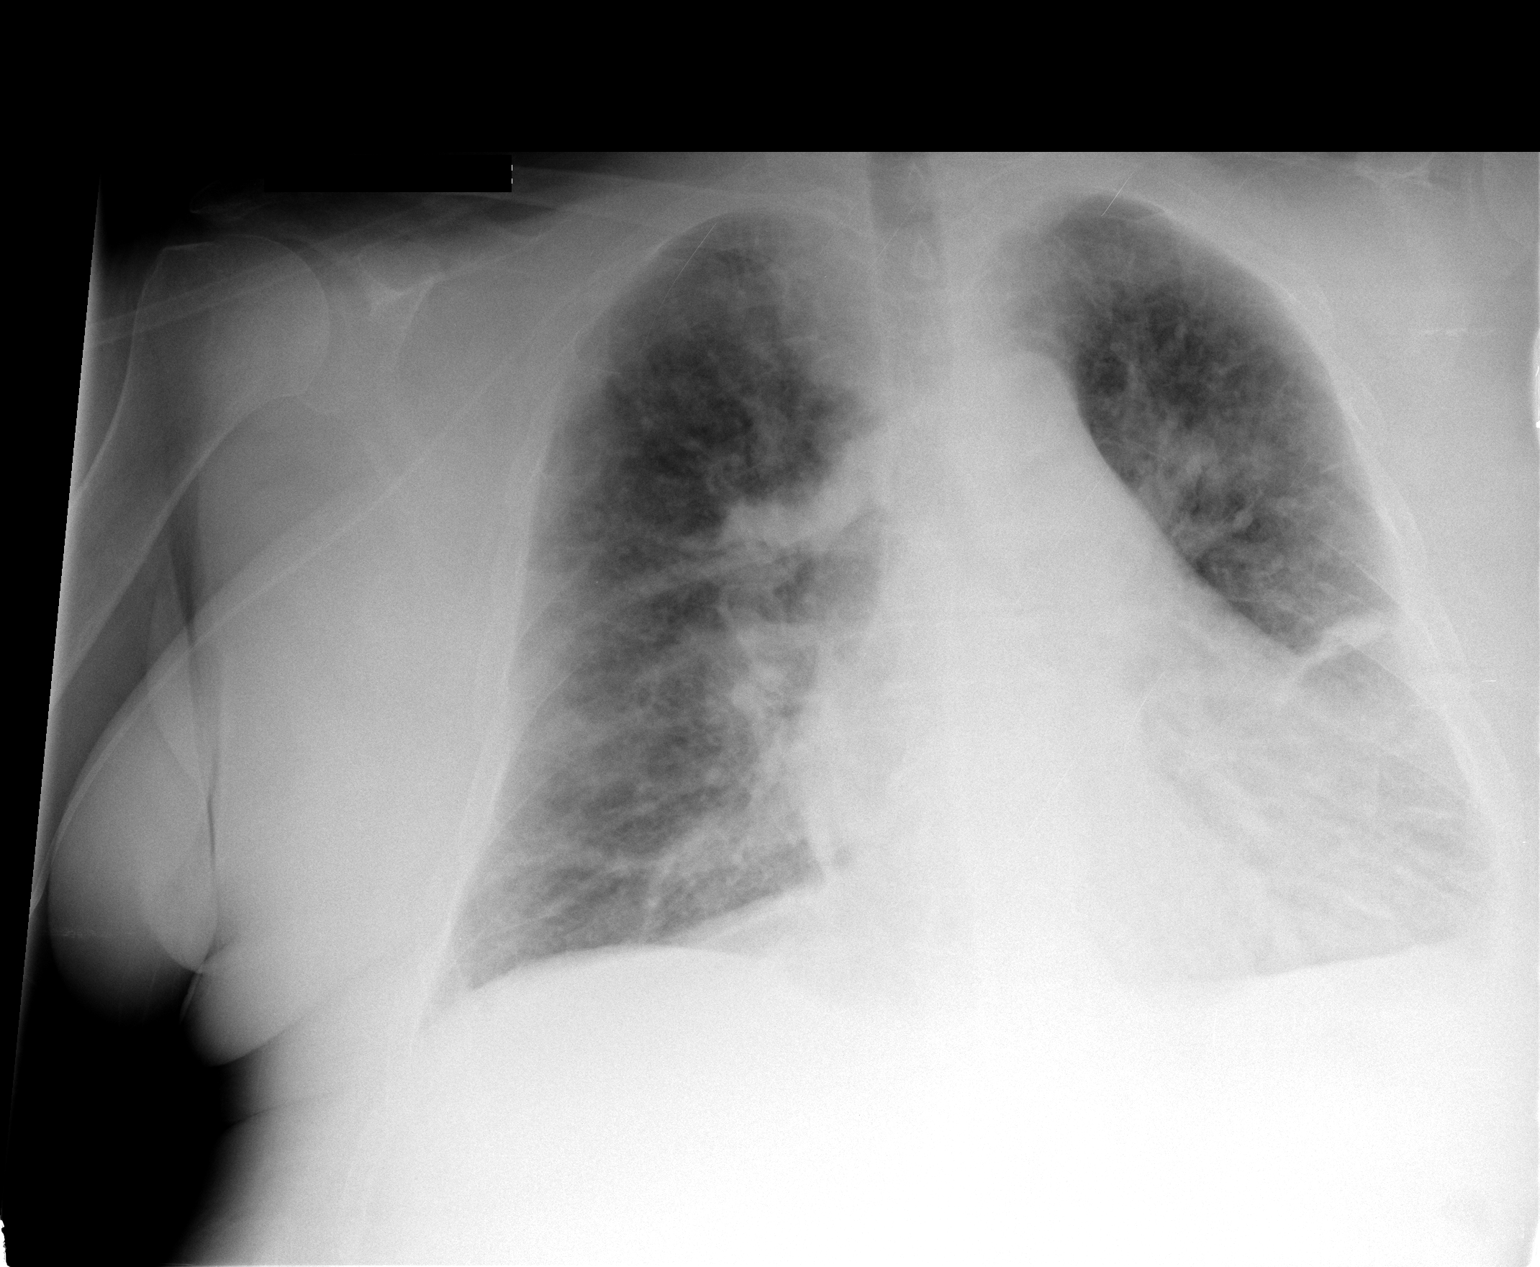

[1 of 1 positions shown; findings below may reference images not displayed]

FINDINGS: Heart is enlarged with persistent mild diffuse
interstitial edema and scattered hilar/lower lobe atelectasis.
Small left effusion noted.  No interval change.  Right PICC line
tip is within the SVC RA junction.
IMPRESSION: Stable CHF and atelectasis pattern

## 2011-08-07 IMAGING — CR DG FOOT 2V*L*
2 series · 2 of 2 positions shown · non-contrast
Comparison: 06/09/2009

CLINICAL DATA: Cellulitis left heel, MRI clearance, surgical drain
left heel

LEFT FOOT - 2 VIEW

[view not recorded (1 of 2)]
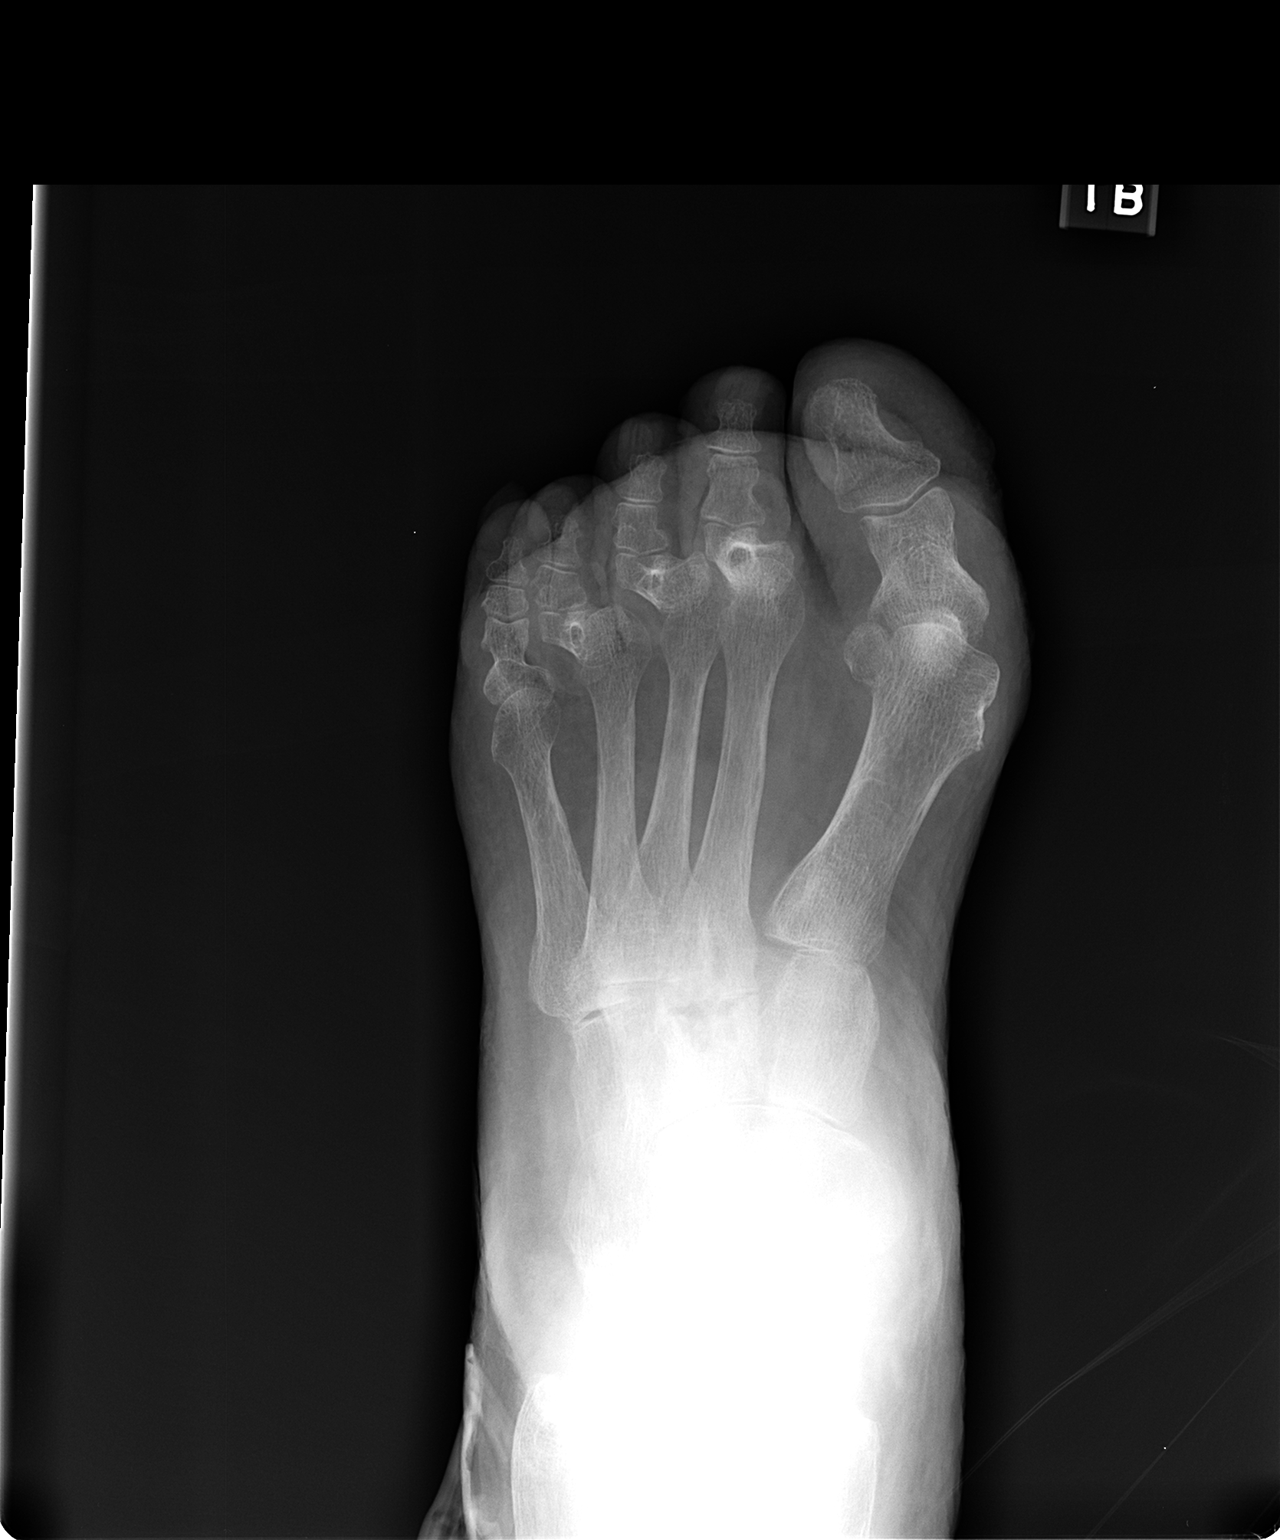

[view not recorded (2 of 2)]
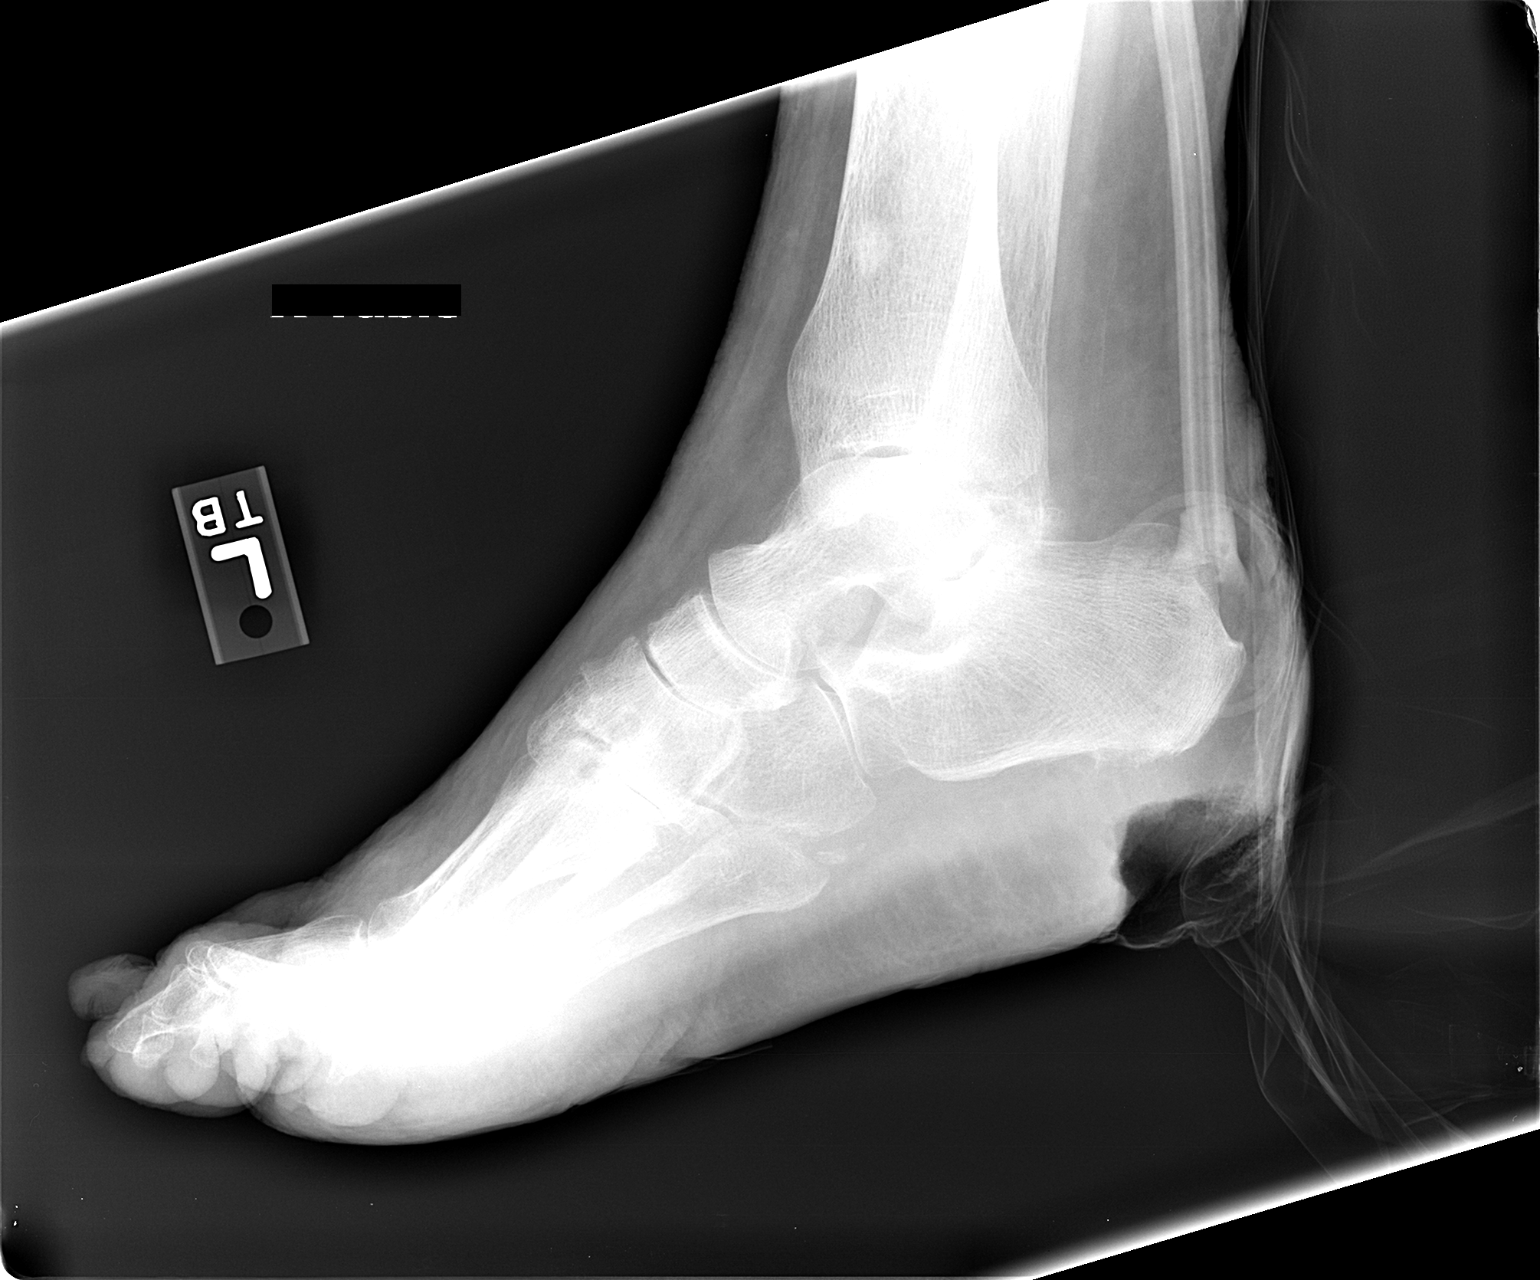

[2 of 2 positions shown; findings below may reference images not displayed]

FINDINGS: Diffuse soft tissue swelling.
Diffuse bony demineralization.
Charcot changes at second and questionably third MTP joints.
Scattered intertarsal joint space narrowing.
Large soft tissue ulcer identified at heel.
Dressings present.
No metallic foreign bodies identified.
No acute fracture or dislocation.
Cortical margin at the inferior aspect of calcaneus is indistinct
most likely representing osteomyelitis.
IMPRESSION: Probable osteomyelitis of the inferior calcaneus.
Charcot changes at the TMT joints as above.
Soft tissue swelling and ulcer.
No metallic foreign body.

## 2011-11-06 ENCOUNTER — Encounter: Payer: Self-pay | Admitting: *Deleted

## 2012-01-05 ENCOUNTER — Encounter: Payer: Self-pay | Admitting: Surgery

## 2012-01-08 ENCOUNTER — Ambulatory Visit (INDEPENDENT_AMBULATORY_CARE_PROVIDER_SITE_OTHER): Payer: Medicare Other | Admitting: Surgery

## 2012-01-08 ENCOUNTER — Encounter: Payer: Self-pay | Admitting: Surgery

## 2012-01-08 ENCOUNTER — Other Ambulatory Visit (INDEPENDENT_AMBULATORY_CARE_PROVIDER_SITE_OTHER): Payer: Medicare Other | Admitting: *Deleted

## 2012-01-08 VITALS — BP 111/99 | HR 77 | Ht 63.5 in | Wt 268.0 lb

## 2012-01-08 DIAGNOSIS — Z48812 Encounter for surgical aftercare following surgery on the circulatory system: Secondary | ICD-10-CM

## 2012-01-08 DIAGNOSIS — I6529 Occlusion and stenosis of unspecified carotid artery: Secondary | ICD-10-CM

## 2012-01-08 NOTE — Progress Notes (Signed)
[facility]   Patient name: Jill Robinson MRN: [id number] DOB: 08-20-1940 Sex: female     Chief Complaint  Patient presents with  . Carotid    1 year f/u    HISTORY OF PRESENT ILLNESS: The patient is back today for followup of her carotid occlusive disease. She is status post left carotid stenting in March of 2012. She has a history of a right brain stroke. A carotid stent was placed because of her body habitus as well as a psoriatic skin condition which I felt made complications very high. She has done very well since her stent. She continues to take her aspirin and Plavix. She has had no new neurologic event  Past Medical History  Diagnosis Date  . Arthritis   . Joint pain   . Stroke   . Ulcer   . COPD (chronic obstructive pulmonary disease)   . Diabetes mellitus   . Carotid artery occlusion   . Hypertension   . Psoriasis   . DVT (deep venous thrombosis)     History reviewed. No pertinent past surgical history.  History   Social History  . Marital Status: Widowed    Spouse Name: N/A    Number of Children: N/A  . Years of Education: N/A   Occupational History  . Not on file.   Social History Main Topics  . Smoking status: Current Every Day Smoker -- 0.5 packs/day    Types: Cigarettes  . Smokeless tobacco: Never Used   Comment: pt states that she is trying to quit  . Alcohol Use: No  . Drug Use: No  . Sexually Active: Not on file   Other Topics Concern  . Not on file   Social History Narrative  . No narrative on file    History reviewed. No pertinent family history.  Allergies as of 01/08/2012 - Review Complete 01/08/2012  Allergen Reaction Noted  . Aspirin      Current Outpatient Prescriptions on File Prior to Visit  Medication Sig Dispense Refill  . albuterol (PROVENTIL HFA;VENTOLIN HFA) 108 (90 BASE) MCG/ACT inhaler Inhale 2 puffs into the lungs every 6 (six) hours as needed.      . barrier cream (NON-SPECIFIED)  CREA Apply 1 application topically 2 (two) times daily as needed.        . clobetasol (TEMOVATE) 0.05 % cream Apply topically 2 (two) times daily.        . clopidogrel (PLAVIX) 75 MG tablet Take 75 mg by mouth daily.        . diphenhydrAMINE (SOMINEX) 25 MG tablet Take 25 mg by mouth as needed.        . ferrous sulfate 325 (65 FE) MG tablet Take 325 mg by mouth daily.      . Fluticasone-Salmeterol (ADVAIR DISKUS IN) Inhale 1 puff into the lungs 2 (two) times daily.        . furosemide (LASIX) 40 MG tablet Take 40 mg by mouth daily.        Marland Kitchen ipratropium (ATROVENT) 0.02 % nebulizer solution Take 500 mcg by nebulization 4 (four) times daily.        . potassium chloride 20 MEQ/15ML (10%) solution Take 20 mEq by mouth daily.        . nicotine (NICODERM CQ - DOSED IN MG/24 HOURS) 21 mg/24hr patch Place 1 patch onto the skin daily.           REVIEW OF SYSTEMS: All systems are negative  as documented by the patient in the encounter form. PHYSICAL EXAMINATION:   Vital signs are BP 111/99  Pulse 77  Ht 5' 3.5" (1.613 m)  Wt 268 lb (121.564 kg)  BMI 46.73 kg/m2  SpO2 99% General: The patient appears their stated age. HEENT:  No gross abnormalities Pulmonary:  Non labored breathing Abdomen: Soft and non-tender Musculoskeletal: There are no major deformities. Neurologic: No focal weakness or paresthesias are detected, Skin: There are no ulcer or rashes noted. Psychiatric: The patient has normal affect. Cardiovascular: There is a regular rate and rhythm without significant murmur appreciated. No carotid bruits   Diagnostic Studies Carotid duplex was ordered and reviewed today. This shows 1-39% right carotid stenosis and a widely patent left internal carotid artery and stent without evidence of hyperplasia or restenosis.  Assessment: Status post left carotid stent Plan: The patient continues to do very well. I have encouraged her to continue taking her aspirin and Plavix. She will followup in  one year with a carotid ultrasound.  Jorge Ny, M.D. [facility] Office: [phone] Pager:  [phone]

## 2012-01-09 ENCOUNTER — Encounter: Payer: Self-pay | Admitting: Surgery

## 2012-01-09 NOTE — Addendum Note (Signed)
Addended by: Sharee Pimple on: 01/09/2012 09:04 AM   Modules accepted: Orders

## 2012-04-23 IMAGING — CR DG ANKLE COMPLETE 3+V*R*
3 series · 3 of 3 positions shown · non-contrast
Comparison: 01/22/2006and a MRI dated 11/03/2008

CLINICAL DATA: Right ankle pain and swelling.

RIGHT ANKLE - COMPLETE 3+ VIEW

[view not recorded (1 of 3)]
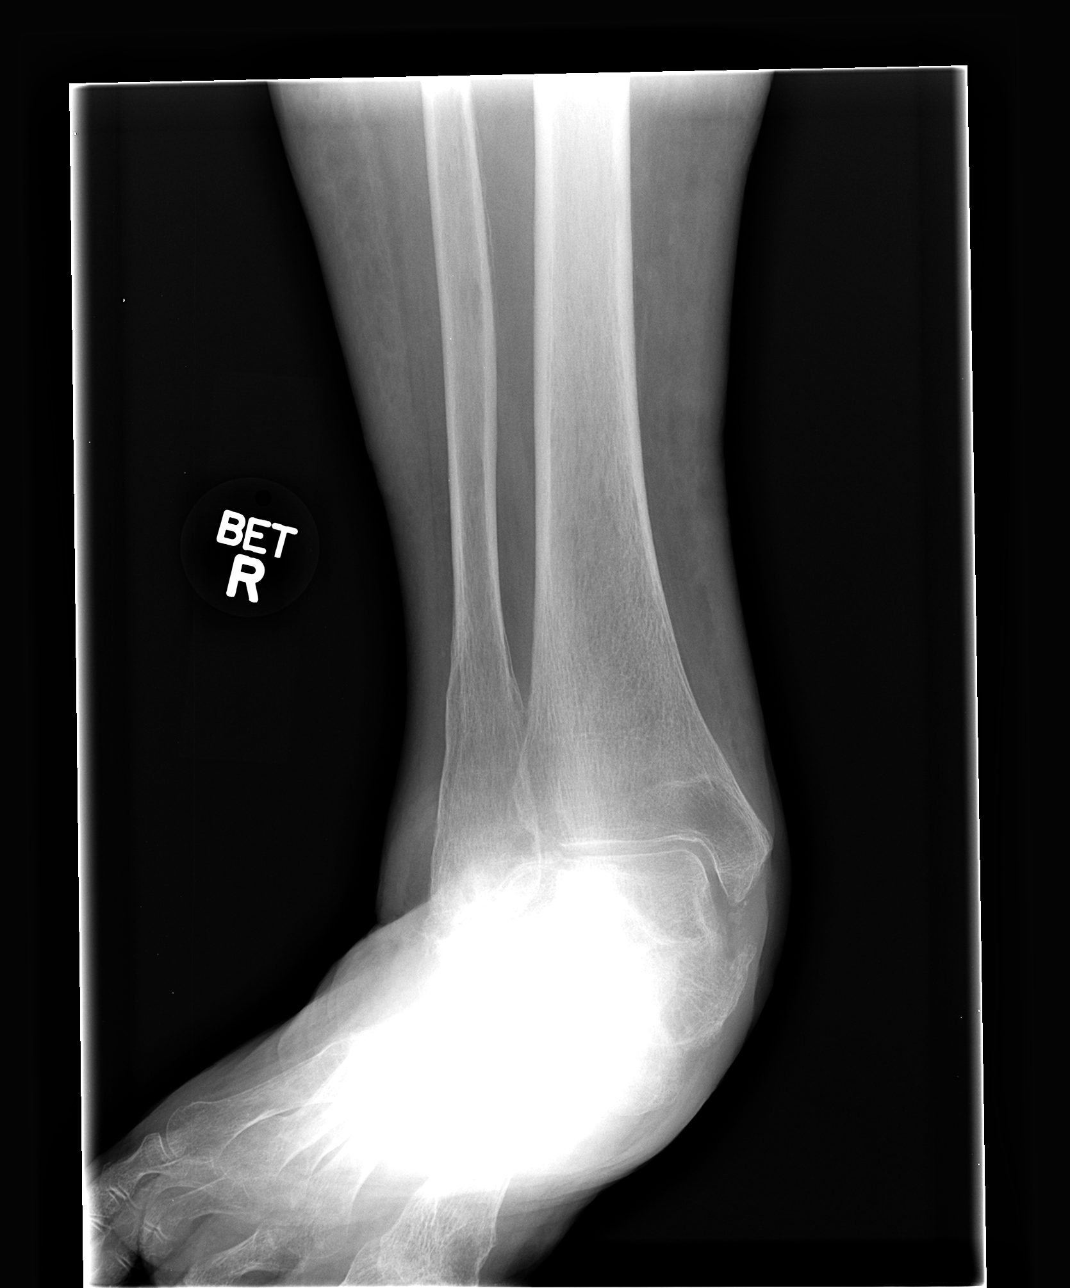

[view not recorded (2 of 3)]
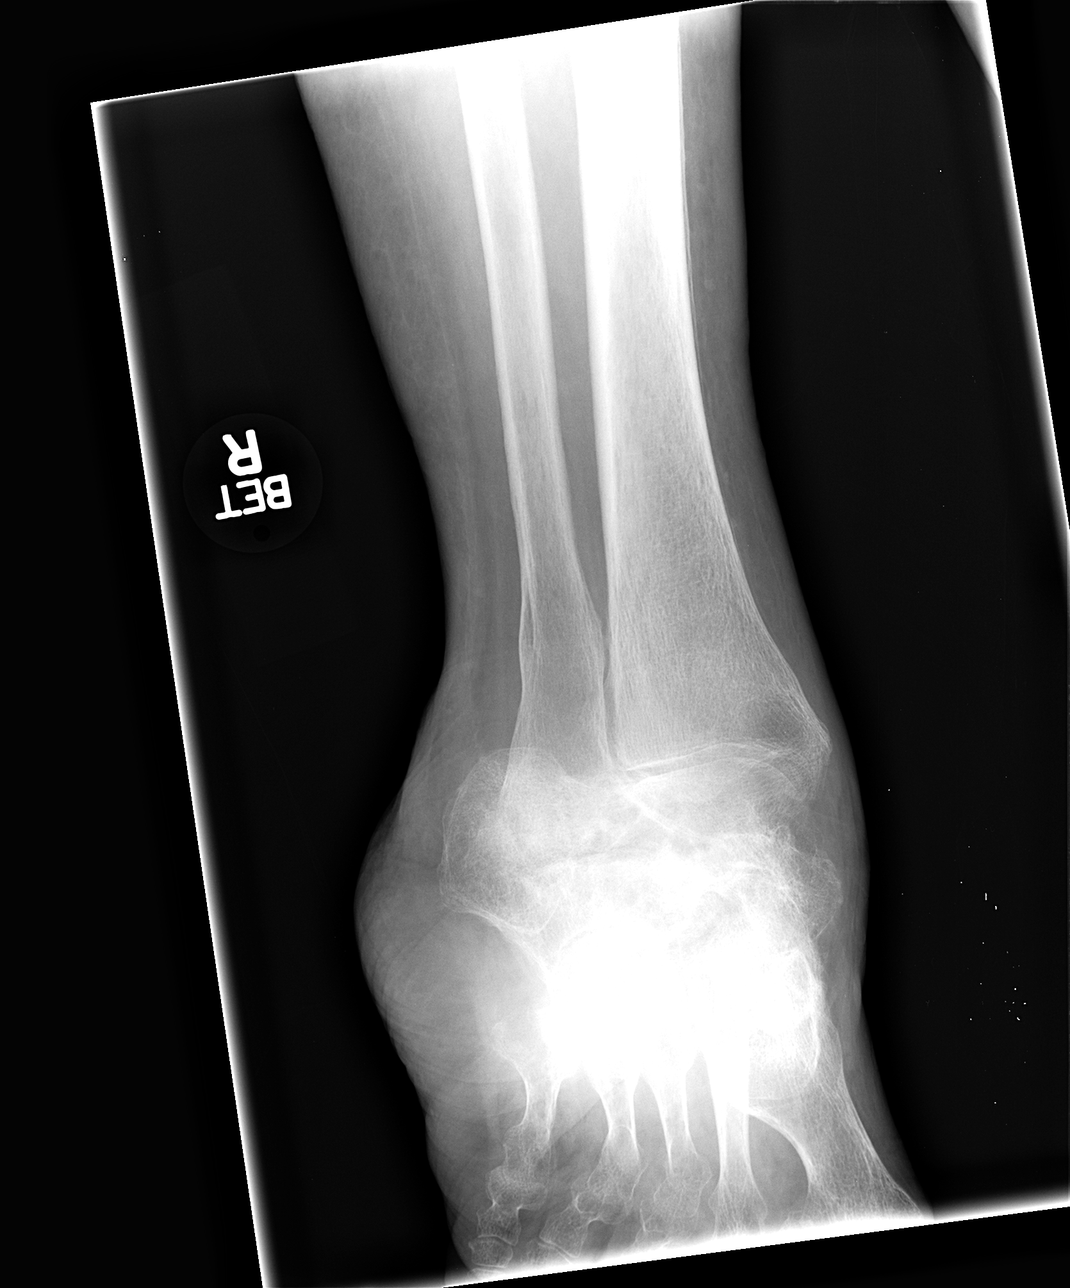

[view not recorded (3 of 3)]
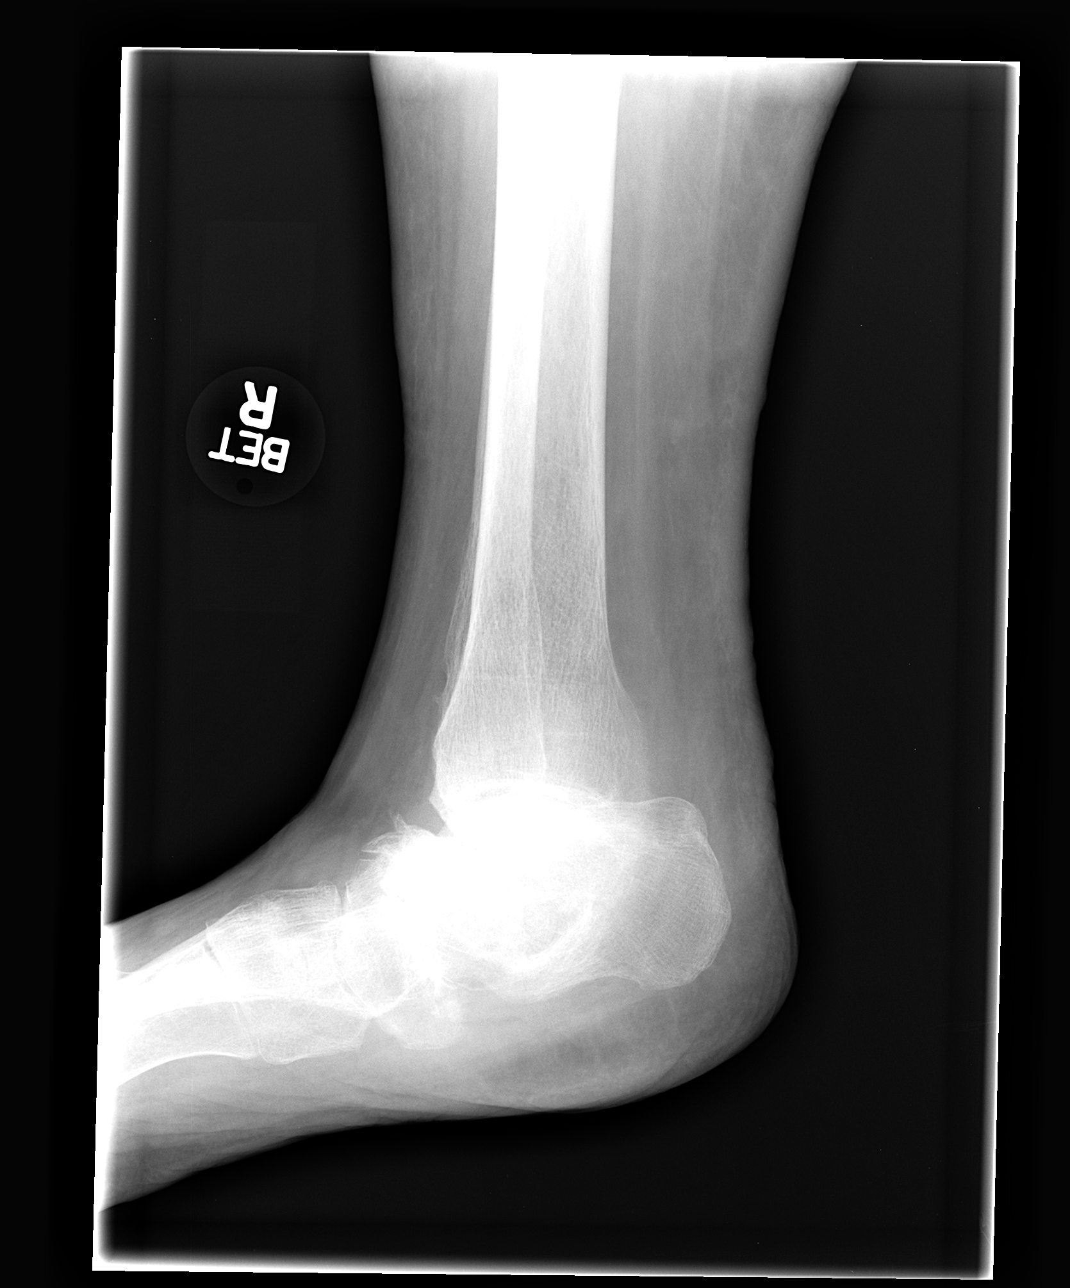

[3 of 3 positions shown; findings below may reference images not displayed]

FINDINGS: The patient has a severe chronic hind foot deformity as
described on prior MRI dated 11/03/2008.  There is diffuse soft
tissue swelling around the ankle and foot which appears to be
chronic.

There are no acute abnormalities.
IMPRESSION: No acute abnormalities.  Severe chronic deformity of the hindfoot
with chronic soft tissue swelling which actually appears less
prominent than on 01/22/2006.

## 2012-05-07 ENCOUNTER — Encounter: Payer: Medicare Other | Admitting: Internal Medicine

## 2012-12-30 ENCOUNTER — Emergency Department (HOSPITAL_COMMUNITY): Payer: Medicare Other

## 2012-12-30 ENCOUNTER — Inpatient Hospital Stay (HOSPITAL_COMMUNITY)
Admission: EM | Admit: 2012-12-30 | Discharge: 2013-01-02 | DRG: 190 | Disposition: A | Discharge status: Home Health | Payer: Medicare Other | Attending: Family Medicine | Admitting: Family Medicine

## 2012-12-30 ENCOUNTER — Encounter (HOSPITAL_COMMUNITY): Payer: Self-pay | Admitting: Emergency Medicine

## 2012-12-30 DIAGNOSIS — J441 Chronic obstructive pulmonary disease with (acute) exacerbation: Principal | ICD-10-CM | POA: Diagnosis present

## 2012-12-30 DIAGNOSIS — J449 Chronic obstructive pulmonary disease, unspecified: Secondary | ICD-10-CM

## 2012-12-30 DIAGNOSIS — Z7902 Long term (current) use of antithrombotics/antiplatelets: Secondary | ICD-10-CM

## 2012-12-30 DIAGNOSIS — M129 Arthropathy, unspecified: Secondary | ICD-10-CM

## 2012-12-30 DIAGNOSIS — Z6841 Body Mass Index (BMI) 40.0 and over, adult: Secondary | ICD-10-CM

## 2012-12-30 DIAGNOSIS — Z23 Encounter for immunization: Secondary | ICD-10-CM

## 2012-12-30 DIAGNOSIS — N289 Disorder of kidney and ureter, unspecified: Secondary | ICD-10-CM | POA: Diagnosis present

## 2012-12-30 DIAGNOSIS — Z86718 Personal history of other venous thrombosis and embolism: Secondary | ICD-10-CM

## 2012-12-30 DIAGNOSIS — F17200 Nicotine dependence, unspecified, uncomplicated: Secondary | ICD-10-CM | POA: Diagnosis present

## 2012-12-30 DIAGNOSIS — S81812A Laceration without foreign body, left lower leg, initial encounter: Secondary | ICD-10-CM

## 2012-12-30 DIAGNOSIS — E119 Type 2 diabetes mellitus without complications: Secondary | ICD-10-CM | POA: Diagnosis present

## 2012-12-30 DIAGNOSIS — S81009A Unspecified open wound, unspecified knee, initial encounter: Secondary | ICD-10-CM | POA: Diagnosis present

## 2012-12-30 DIAGNOSIS — Y9210 Unspecified residential institution as the place of occurrence of the external cause: Secondary | ICD-10-CM | POA: Diagnosis present

## 2012-12-30 DIAGNOSIS — Z8673 Personal history of transient ischemic attack (TIA), and cerebral infarction without residual deficits: Secondary | ICD-10-CM

## 2012-12-30 DIAGNOSIS — J9600 Acute respiratory failure, unspecified whether with hypoxia or hypercapnia: Secondary | ICD-10-CM | POA: Diagnosis present

## 2012-12-30 DIAGNOSIS — D649 Anemia, unspecified: Secondary | ICD-10-CM | POA: Diagnosis present

## 2012-12-30 DIAGNOSIS — L02419 Cutaneous abscess of limb, unspecified: Secondary | ICD-10-CM

## 2012-12-30 DIAGNOSIS — Z79899 Other long term (current) drug therapy: Secondary | ICD-10-CM

## 2012-12-30 DIAGNOSIS — N318 Other neuromuscular dysfunction of bladder: Secondary | ICD-10-CM

## 2012-12-30 DIAGNOSIS — R030 Elevated blood-pressure reading, without diagnosis of hypertension: Secondary | ICD-10-CM

## 2012-12-30 DIAGNOSIS — R609 Edema, unspecified: Secondary | ICD-10-CM

## 2012-12-30 DIAGNOSIS — I10 Essential (primary) hypertension: Secondary | ICD-10-CM | POA: Diagnosis present

## 2012-12-30 DIAGNOSIS — Z9981 Dependence on supplemental oxygen: Secondary | ICD-10-CM

## 2012-12-30 DIAGNOSIS — L538 Other specified erythematous conditions: Secondary | ICD-10-CM | POA: Diagnosis present

## 2012-12-30 DIAGNOSIS — R0602 Shortness of breath: Secondary | ICD-10-CM

## 2012-12-30 DIAGNOSIS — N390 Urinary tract infection, site not specified: Secondary | ICD-10-CM

## 2012-12-30 DIAGNOSIS — J9601 Acute respiratory failure with hypoxia: Secondary | ICD-10-CM

## 2012-12-30 DIAGNOSIS — X58XXXA Exposure to other specified factors, initial encounter: Secondary | ICD-10-CM

## 2012-12-30 DIAGNOSIS — F411 Generalized anxiety disorder: Secondary | ICD-10-CM | POA: Diagnosis present

## 2012-12-30 DIAGNOSIS — Z87442 Personal history of urinary calculi: Secondary | ICD-10-CM

## 2012-12-30 LAB — BASIC METABOLIC PANEL
BUN: 17 mg/dL (ref 6–23)
Calcium: 9.3 mg/dL (ref 8.4–10.5)
Chloride: 101 mEq/L (ref 96–112)
Creatinine, Ser: 1.2 mg/dL — ABNORMAL HIGH (ref 0.50–1.10)
GFR calc Af Amer: 52 mL/min — ABNORMAL LOW (ref >90)
Glucose, Bld: 202 mg/dL — ABNORMAL HIGH (ref 70–99)
Potassium: 4.4 mEq/L (ref 3.5–5.1)

## 2012-12-30 LAB — CBC WITH DIFFERENTIAL/PLATELET
Basophils Relative: 0 % (ref 0–1)
Eosinophils Relative: 4 % (ref 0–5)
HCT: 34.4 % — ABNORMAL LOW (ref 36.0–46.0)
Hemoglobin: 10.5 g/dL — ABNORMAL LOW (ref 12.0–15.0)
Lymphocytes Relative: 15 % (ref 12–46)
MCH: 31.3 pg (ref 26.0–34.0)
MCHC: 30.5 g/dL (ref 30.0–36.0)
MCV: 102.4 fL — ABNORMAL HIGH (ref 78.0–100.0)
Monocytes Absolute: 0.5 10*3/uL (ref 0.1–1.0)
Monocytes Relative: 3 % (ref 3–12)
Neutro Abs: 13.1 10*3/uL — ABNORMAL HIGH (ref 1.7–7.7)
Neutrophils Relative %: 78 % — ABNORMAL HIGH (ref 43–77)
RDW: 15.6 % — ABNORMAL HIGH (ref 11.5–15.5)

## 2012-12-30 LAB — GLUCOSE, CAPILLARY
Glucose-Capillary: 153 mg/dL — ABNORMAL HIGH (ref 70–99)
Glucose-Capillary: 156 mg/dL — ABNORMAL HIGH (ref 70–99)

## 2012-12-30 LAB — MRSA PCR SCREENING: MRSA by PCR: POSITIVE — AB

## 2012-12-30 MED ORDER — SODIUM CHLORIDE 0.9 % IV SOLN: 250.0000 mL | INTRAVENOUS | Status: DC | PRN

## 2012-12-30 MED ORDER — SODIUM CHLORIDE 0.9 % IJ SOLN
3.0000 mL | Freq: Two times a day (BID) | INTRAMUSCULAR | Status: DC
  Administered 2012-12-30 – 2013-01-02 (×5): 3 mL via INTRAVENOUS

## 2012-12-30 MED ORDER — CHLORHEXIDINE GLUCONATE CLOTH 2 % EX PADS
6.0000 | MEDICATED_PAD | Freq: Every day | CUTANEOUS | Status: DC
  Administered 2012-12-30 – 2013-01-02 (×4): 6 via TOPICAL

## 2012-12-30 MED ORDER — FERROUS SULFATE 325 (65 FE) MG PO TABS
325.0000 mg | ORAL_TABLET | Freq: Every day | ORAL | Status: DC
  Administered 2012-12-30 – 2013-01-02 (×4): 325 mg via ORAL
  Filled 2012-12-30 (×4): qty 1

## 2012-12-30 MED ORDER — GUAIFENESIN ER 600 MG PO TB12
600.0000 mg | ORAL_TABLET | Freq: Two times a day (BID) | ORAL | Status: DC
  Administered 2012-12-30 – 2013-01-02 (×7): 600 mg via ORAL
  Filled 2012-12-30 (×7): qty 1

## 2012-12-30 MED ORDER — METHYLPREDNISOLONE SODIUM SUCC 125 MG IJ SOLR
125.0000 mg | Freq: Once | INTRAMUSCULAR | Status: AC
  Administered 2012-12-30: 125 mg via INTRAVENOUS
  Filled 2012-12-30: qty 2

## 2012-12-30 MED ORDER — AZITHROMYCIN 250 MG PO TABS
250.0000 mg | ORAL_TABLET | Freq: Every day | ORAL | Status: DC
  Administered 2012-12-31 – 2013-01-02 (×3): 250 mg via ORAL
  Filled 2012-12-30 (×3): qty 1

## 2012-12-30 MED ORDER — POTASSIUM CHLORIDE CRYS ER 20 MEQ PO TBCR
20.0000 meq | EXTENDED_RELEASE_TABLET | Freq: Every day | ORAL | Status: DC
  Administered 2012-12-30 – 2013-01-02 (×4): 20 meq via ORAL
  Filled 2012-12-30 (×4): qty 1

## 2012-12-30 MED ORDER — METHYLPREDNISOLONE SODIUM SUCC 125 MG IJ SOLR
80.0000 mg | Freq: Two times a day (BID) | INTRAMUSCULAR | Status: DC
  Administered 2012-12-30 – 2013-01-01 (×4): 80 mg via INTRAVENOUS
  Filled 2012-12-30 (×4): qty 2

## 2012-12-30 MED ORDER — FOLIC ACID 1 MG PO TABS
1.0000 mg | ORAL_TABLET | Freq: Every day | ORAL | Status: DC
  Administered 2012-12-30 – 2013-01-02 (×4): 1 mg via ORAL
  Filled 2012-12-30 (×4): qty 1

## 2012-12-30 MED ORDER — TETANUS-DIPHTH-ACELL PERTUSSIS 5-2.5-18.5 LF-MCG/0.5 IM SUSP
0.5000 mL | Freq: Once | INTRAMUSCULAR | Status: AC
  Administered 2012-12-30: 0.5 mL via INTRAMUSCULAR
  Filled 2012-12-30: qty 0.5

## 2012-12-30 MED ORDER — MUPIROCIN 2 % EX OINT
1.0000 "application " | TOPICAL_OINTMENT | Freq: Two times a day (BID) | CUTANEOUS | Status: DC
  Administered 2012-12-30 – 2013-01-02 (×7): 1 via NASAL
  Filled 2012-12-30 (×2): qty 22

## 2012-12-30 MED ORDER — ALBUTEROL SULFATE (5 MG/ML) 0.5% IN NEBU: 2.5000 mg | INHALATION_SOLUTION | RESPIRATORY_TRACT | Status: DC | PRN

## 2012-12-30 MED ORDER — BIOTENE DRY MOUTH MT LIQD
15.0000 mL | Freq: Two times a day (BID) | OROMUCOSAL | Status: DC
  Administered 2012-12-30 – 2013-01-02 (×7): 15 mL via OROMUCOSAL

## 2012-12-30 MED ORDER — IPRATROPIUM BROMIDE 0.02 % IN SOLN
RESPIRATORY_TRACT | Status: AC
  Administered 2012-12-30: 0.5 mg
  Filled 2012-12-30: qty 2.5

## 2012-12-30 MED ORDER — SODIUM CHLORIDE 0.9 % IJ SOLN
3.0000 mL | Freq: Two times a day (BID) | INTRAMUSCULAR | Status: DC
  Administered 2012-12-30 – 2012-12-31 (×2): 3 mL via INTRAVENOUS

## 2012-12-30 MED ORDER — ALBUTEROL (5 MG/ML) CONTINUOUS INHALATION SOLN
10.0000 mg/h | INHALATION_SOLUTION | RESPIRATORY_TRACT | Status: DC
  Administered 2012-12-30: 10 mg/h via RESPIRATORY_TRACT

## 2012-12-30 MED ORDER — CLOPIDOGREL BISULFATE 75 MG PO TABS
75.0000 mg | ORAL_TABLET | Freq: Every day | ORAL | Status: DC
  Administered 2012-12-30 – 2013-01-02 (×4): 75 mg via ORAL
  Filled 2012-12-30 (×4): qty 1

## 2012-12-30 MED ORDER — FUROSEMIDE 10 MG/ML IJ SOLN
40.0000 mg | Freq: Two times a day (BID) | INTRAMUSCULAR | Status: DC
  Administered 2012-12-30 – 2012-12-31 (×4): 40 mg via INTRAVENOUS
  Filled 2012-12-30 (×4): qty 4

## 2012-12-30 MED ORDER — ALBUTEROL SULFATE (5 MG/ML) 0.5% IN NEBU
INHALATION_SOLUTION | RESPIRATORY_TRACT | Status: AC
  Administered 2012-12-30: 5 mg
  Filled 2012-12-30: qty 1

## 2012-12-30 MED ORDER — FUROSEMIDE 40 MG PO TABS
40.0000 mg | ORAL_TABLET | Freq: Every day | ORAL | Status: DC
  Filled 2012-12-30: qty 1

## 2012-12-30 MED ORDER — LEVALBUTEROL HCL 0.63 MG/3ML IN NEBU: 0.6300 mg | INHALATION_SOLUTION | Freq: Four times a day (QID) | RESPIRATORY_TRACT | Status: DC | PRN

## 2012-12-30 MED ORDER — LEVALBUTEROL HCL 0.63 MG/3ML IN NEBU
0.6300 mg | INHALATION_SOLUTION | Freq: Four times a day (QID) | RESPIRATORY_TRACT | Status: DC
  Administered 2012-12-30 – 2013-01-02 (×12): 0.63 mg via RESPIRATORY_TRACT
  Filled 2012-12-30 (×12): qty 3

## 2012-12-30 MED ORDER — FUROSEMIDE 10 MG/ML IJ SOLN
40.0000 mg | Freq: Once | INTRAMUSCULAR | Status: AC
Start: 2012-12-30 — End: 2012-12-30
  Administered 2012-12-30: 40 mg via INTRAVENOUS
  Filled 2012-12-30: qty 4

## 2012-12-30 MED ORDER — SODIUM CHLORIDE 0.9 % IJ SOLN: 3.0000 mL | INTRAMUSCULAR | Status: DC | PRN

## 2012-12-30 MED ORDER — IPRATROPIUM BROMIDE 0.02 % IN SOLN
0.5000 mg | Freq: Four times a day (QID) | RESPIRATORY_TRACT | Status: DC
  Administered 2012-12-30 – 2013-01-02 (×12): 0.5 mg via RESPIRATORY_TRACT
  Filled 2012-12-30 (×12): qty 2.5

## 2012-12-30 MED ORDER — INSULIN ASPART 100 UNIT/ML ~~LOC~~ SOLN
0.0000 [IU] | Freq: Three times a day (TID) | SUBCUTANEOUS | Status: DC
  Administered 2012-12-30 (×3): 3 [IU] via SUBCUTANEOUS
  Administered 2012-12-31 – 2013-01-02 (×4): 2 [IU] via SUBCUTANEOUS
  Administered 2013-01-02: 3 [IU] via SUBCUTANEOUS

## 2012-12-30 MED ORDER — AZITHROMYCIN 250 MG PO TABS
500.0000 mg | ORAL_TABLET | Freq: Every day | ORAL | Status: AC
  Administered 2012-12-30: 500 mg via ORAL
  Filled 2012-12-30: qty 2

## 2012-12-30 NOTE — ED Notes (Signed)
Pt finished breathing tx. Removed aerosol mask & pt placed on 3l O2 by nasal canula.

## 2012-12-30 NOTE — Progress Notes (Signed)
Patient MRSA positive, MD informed.

## 2012-12-30 NOTE — Care Management Note (Signed)
    Page 1 of 1   01/02/2013     9:35:43 AM   CARE MANAGEMENT NOTE 01/02/2013  Patient:  Jill Robinson, Jill Robinson   Account Number:  [id number]  Date Initiated:  12/30/2012  Documentation initiated by:  Sharrie Rothman  Subjective/Objective Assessment:   Pt admitted from home with COPD. Pt lives with her son and granddaughter. Pt will return home at discharge. Pts granddaughter provides mod assistance with pts ADL's. Pt has Home O2, walker, BSC for home use.     Action/Plan:   Pt may benefit from [facility] RN followup at discharge. Will continue to follow.   Anticipated DC Date:  01/03/2013   Anticipated DC Plan:  HOME W HOME HEALTH SERVICES      DC Planning Services  CM consult      Choice offered to / List presented to:             Status of service:  Completed, signed off Medicare Important Message given?  YES (If response is "NO", the following Medicare IM given date fields will be blank) Date Medicare IM given:  01/02/2013 Date Additional Medicare IM given:    Discharge Disposition:  HOME/SELF CARE  Per UR Regulation:    If discussed at Long Length of Stay Meetings, dates discussed:    Comments:  01/02/13 0935 Arlyss Queen, RN BSN CM Pt discharged home today. Pt refuses any HH at this time stating that her daughter and son would be there to care for her. No other CM needs noted.  12/30/12 1510 Arlyss Queen, RN BSN CM

## 2012-12-30 NOTE — ED Provider Notes (Signed)
CSN: [id number]     Arrival date & time 12/30/12  8295 History   First MD Initiated Contact with Patient 12/30/12 [phone]     Chief Complaint  Patient presents with  . Shortness of Breath   (Consider location/radiation/quality/duration/timing/severity/associated sxs/prior Treatment) Patient is a 72 y.o. female presenting with shortness of breath. The history is provided by the patient and the EMS personnel. The history is limited by the condition of the patient (Severe dyspnea).  Shortness of Breath She presents to the ED via ambulance complaining of severe dyspnea. EMS noted hypoxia with oxygen saturation of 86% and started her on May breathing treatment with albuterol and ipratropium. Also, she apparently suffered a skin tear to her left lower leg when being transferred from ambulance stretcher to the hospital stretcher.  Past Medical History  Diagnosis Date  . Arthritis   . Joint pain   . Stroke   . Ulcer   . COPD (chronic obstructive pulmonary disease)   . Diabetes mellitus   . Carotid artery occlusion   . Hypertension   . Psoriasis   . DVT (deep venous thrombosis)    Past Surgical History  Procedure Laterality Date  . Carotid stent  2012   History reviewed. No pertinent family history. History  Substance Use Topics  . Smoking status: Current Every Day Smoker -- 0.50 packs/day    Types: Cigarettes  . Smokeless tobacco: Never Used     Comment: pt states that she is trying to quit  . Alcohol Use: No   OB History   Grav Para Term Preterm Abortions TAB SAB Ect Mult Living                 Review of Systems  Unable to perform ROS: Acuity of condition  Respiratory: Positive for shortness of breath.   All other systems reviewed and are negative.    Allergies  Aspirin  Home Medications   Current Outpatient Rx  Name  Route  Sig  Dispense  Refill  . albuterol (PROVENTIL HFA;VENTOLIN HFA) 108 (90 BASE) MCG/ACT inhaler   Inhalation   Inhale 2 puffs into the lungs every  6 (six) hours as needed.         . barrier cream (NON-SPECIFIED) CREA   Topical   Apply 1 application topically 2 (two) times daily as needed.           . clobetasol (TEMOVATE) 0.05 % cream   Topical   Apply topically 2 (two) times daily.           . clopidogrel (PLAVIX) 75 MG tablet   Oral   Take 75 mg by mouth daily.           . diphenhydrAMINE (SOMINEX) 25 MG tablet   Oral   Take 25 mg by mouth as needed.           . ferrous sulfate 325 (65 FE) MG tablet   Oral   Take 325 mg by mouth daily.         . Fluticasone-Salmeterol (ADVAIR DISKUS IN)   Inhalation   Inhale 1 puff into the lungs 2 (two) times daily.           . folic acid (FOLVITE) 1 MG tablet   Oral   Take 1 mg by mouth daily.         . furosemide (LASIX) 40 MG tablet   Oral   Take 40 mg by mouth daily.           Marland Kitchen  GARLIC PO   Oral   Take by mouth daily.         Marland Kitchen ipratropium (ATROVENT) 0.02 % nebulizer solution   Nebulization   Take 500 mcg by nebulization 4 (four) times daily.           . meclizine (ANTIVERT) 25 MG tablet   Oral   Take 25 mg by mouth 4 (four) times daily as needed. 1/2 tablet         . methotrexate 2.5 MG tablet   Oral   Take by mouth. Take 8 tablets every Wenesday         . Omega-3 Fatty Acids (FISH OIL PO)   Oral   Take by mouth daily.         . potassium chloride SA (K-DUR,KLOR-CON) 20 MEQ tablet   Oral   Take 20 mEq by mouth daily.         . nicotine (NICODERM CQ - DOSED IN MG/24 HOURS) 21 mg/24hr patch   Transdermal   Place 1 patch onto the skin daily.           . potassium chloride 20 MEQ/15ML (10%) solution   Oral   Take 20 mEq by mouth daily.            BP 115/74  Pulse 131  Resp 40  Wt 268 lb (121.564 kg)  BMI 46.72 kg/m2  SpO2 100% Physical Exam  Nursing note and vitals reviewed.  Use 71 year old female, in mild respiratory distres.. She is tachypnea and using some of her accessory muscles of respiration. Vital signs  are significant for tachycardia with heart rate 131, and tachypnea with respiratory rate of 40. Oxygen saturation is 100%, which is normal, but this is while on a breathing treatment with 100% oxygen. Head is normocephalic and atraumatic. PERRLA, EOMI. Oropharynx is clear. Neck is nontender and supple without adenopathy or JVD. Back is nontender and there is no CVA tenderness. Lungs  have coarse expiratory and expiratory wheezes throughout. Chest is nontender. Heart has regular rate and rhythm without murmur. Abdomen is soft, flat, nontender without masses or hepatosplenomegaly and peristalsis is normoactive. Extremities have 1+ edema, full range of motion is present. deformity is present at the right ankle with the foot rotated outward. Skin tear is present in left anterior lower leg. Venous stasis changes are present bilaterally. Skin is warm and dry without rash. Neurologic:  She is awake but nonverbal. She will follow commands. Cranial nerves are intact, there are no motor or sensory deficits.  ED Course  Procedures (including critical care time) Labs Review Results for orders placed during the hospital encounter of 12/30/12  CBC WITH DIFFERENTIAL      Result Value Range   WBC 16.8 (*) 4.0 - 10.5 K/uL   RBC 3.36 (*) 3.87 - 5.11 MIL/uL   Hemoglobin 10.5 (*) 12.0 - 15.0 g/dL   HCT 16.1 (*) 09.6 - 04.5 %   MCV 102.4 (*) 78.0 - 100.0 fL   MCH 31.3  26.0 - 34.0 pg   MCHC 30.5  30.0 - 36.0 g/dL   RDW 40.9 (*) 81.1 - 91.4 %   Platelets 408 (*) 150 - 400 K/uL   Neutrophils Relative % 78 (*) 43 - 77 %   Neutro Abs 13.1 (*) 1.7 - 7.7 K/uL   Lymphocytes Relative 15  12 - 46 %   Lymphs Abs 2.4  0.7 - 4.0 K/uL   Monocytes Relative 3  3 - 12 %  Monocytes Absolute 0.5  0.1 - 1.0 K/uL   Eosinophils Relative 4  0 - 5 %   Eosinophils Absolute 0.6  0.0 - 0.7 K/uL   Basophils Relative 0  0 - 1 %   Basophils Absolute 0.1  0.0 - 0.1 K/uL  BASIC METABOLIC PANEL      Result Value Range   Sodium  138  135 - 145 mEq/L   Potassium 4.4  3.5 - 5.1 mEq/L   Chloride 101  96 - 112 mEq/L   CO2 30  19 - 32 mEq/L   Glucose, Bld 202 (*) 70 - 99 mg/dL   BUN 17  6 - 23 mg/dL   Creatinine, Ser 1.61 (*) 0.50 - 1.10 mg/dL   Calcium 9.3  8.4 - 09.6 mg/dL   GFR calc non Af Amer 45 (*) >90 mL/min   GFR calc Af Amer 52 (*) >90 mL/min  PRO B NATRIURETIC PEPTIDE      Result Value Range   Pro B Natriuretic peptide (BNP) 494.9 (*) 0 - 125 pg/mL   Imaging Review Dg Chest Port 1 View  12/30/2012   *RADIOLOGY REPORT*  Clinical Data: Oxygen desaturation.  PORTABLE CHEST - 1 VIEW  Comparison: CT of the chest February 23, 2011  Findings: Cardiac silhouette appears moderately enlarged.  Diffuse interstitial prominence with central pulmonary vasculature congestion.  No definite pleural effusions or focal consolidations though, large body habitus limits evaluation.  No pneumothorax though lung apices obscured by facial structures.  The patient appears edentulous.  IMPRESSION: Cardiomegaly with central pulmonary vascular congestion and interstitial prominence favoring moderate pulmonary edema. Consider follow-up PA and lateral views of chest, after treatment  to verify improvement.   Original Report Authenticated By: Awilda Metro   Image viewed by me.  EKG Interpretation     Ventricular Rate:  119 PR Interval:  188 QRS Duration: 94 QT Interval:  304 QTC Calculation: 427 R Axis:   62 Text Interpretation:  Sinus tachycardia Otherwise normal ECG When compared with ECG of 09-Mar-2010 07:20, No significant change was found            MDM   1. COPD exacerbation   2. Peripheral edema   3. Skin tear of lower leg without complication, left, initial encounter   4. Hypertension   5. Anemia   6. Renal insufficiency    Exacerbation of COPD. Chest x-ray was obtained to rule out pneumonia and she'll be given methylprednisolone as well as ongoing treatment with albuterol and ipratropium. Old records are  reviewed and she has prior admissions for stroke and carotid endarterectomy but no recent ED visits or hospitalizations for COPD.  After initial during treatment, she was not significantly improved. She was given a 1 hour long continuous nebulizer following which she was able to speak in sentences. She is still tachypneic although not using accessory muscles of respiration. Chest x-ray has come back looking as if she might have significant component of congestive heart failure but BNP is only slightly elevated. She does have peripheral edema since she's given a single dose of furosemide. She does not appear well enough to go home at this point. She still tachypneic and tachycardic. Case is discussed with Dr. Onalee Hua of [facility] who agrees to admit the patient.  Dione Booze, MD 12/30/12 [phone]

## 2012-12-30 NOTE — Consult Note (Signed)
WOC wound consult note Reason for Consult: Consult requested for buttocks and abd and breast skin folds.  Consult performed via remote camera with bedside nurse assistance to measure sites. Pt has skin tears to legs which nurse previously assessed and appropriately managed with foam dressings.  These sites were not included in the consult. Wound type:  Buttocks with generalized erythremia and partial thickness skin loss consistent with moisture-associated skin damage.  Pt admits she has been sitting in a chair for prolonged periods of time and was staying wet prior to admission.  Macerated areas cover entire bilat buttocks and upper thighs.  Patchy areas of skin loss were previously measured and documented by the bedside nurse. Pressure Ulcer POA: These are NOT pressure ulcers. Measurement: According to documentation, there are several partial thickness wounds to inner buttocks; .5X3X.1cm, 2X2X.1cm, .5X.5X.1cm Skin folds to abd and breasts red and macerated, appearance consistent with Intertrigo.  Moist with odor.  Dressing procedure/placement/frequency: Interdry silver-impregnated fabric to wick moisture away from skin and provide antimicrobial benefits to abd and breast skin folds.  Instructions provided to staff nurses.   Barrier cream to buttocks to protect and promote healing.  Air mattress to increase airflow to buttocks and reduce pressure.  Please re-consult if further assistance is needed.  Thank-you,  Cammie Mcgee MSN, RN, CWOCN, [place], CNS [phone]

## 2012-12-30 NOTE — ED Notes (Signed)
Patient has a history of COPD, having difficulty breathing, sats of 86% on a nebulizer. Duo-neb given en route to the ER by EMS.

## 2012-12-30 NOTE — Progress Notes (Signed)
  Jill Robinson ZOX:[id number] DOB: [birth year] DOA: 12/30/2012 PCP: Milana Obey, MD   Subjective: This 72 year old morbidly obese lady was admitted last night with acute dyspnea which woke her from her sleep. She says she has not done this before. She says that her breathing was okay during the daytime yesterday. She does have a history of COPD and is still a smoker, now smoking 4-5 cigarettes per day. She denies any specific cough or fever. She does feel somewhat better since being hospitalized.           Physical Exam: Blood pressure 97/55, pulse 117, temperature 98.3 F (36.8 C), temperature source Oral, resp. rate 32, height 5\' 3"  (1.6 m), weight 122.5 kg (270 lb 1 oz), SpO2 97.00%. She looks systemically well. She is not toxic or septic. She is morbidly obese. There is no peripheral or central cyanosis. Lung fields show scattered wheezing bilaterally but also inspiratory crackles at both bases. There is no significant peripheral pitting edema in her legs. Heart sounds are present without gallop rhythm. She is alert and orientated.   Investigations:     Basic Metabolic Panel:  Recent Labs  81/19/14 0404  NA 138  K 4.4  CL 101  CO2 30  GLUCOSE 202*  BUN 17  CREATININE 1.20*  CALCIUM 9.3       CBC:  Recent Labs  12/30/12 0404  WBC 16.8*  NEUTROABS 13.1*  HGB 10.5*  HCT 34.4*  MCV 102.4*  PLT 408*    Dg Chest Port 1 View  12/30/2012   *RADIOLOGY REPORT*  Clinical Data: Oxygen desaturation.  PORTABLE CHEST - 1 VIEW  Comparison: CT of the chest February 23, 2011  Findings: Cardiac silhouette appears moderately enlarged.  Diffuse interstitial prominence with central pulmonary vasculature congestion.  No definite pleural effusions or focal consolidations though, large body habitus limits evaluation.  No pneumothorax though lung apices obscured by facial structures.  The patient appears edentulous.  IMPRESSION: Cardiomegaly with central pulmonary  vascular congestion and interstitial prominence favoring moderate pulmonary edema. Consider follow-up PA and lateral views of chest, after treatment  to verify improvement.   Original Report Authenticated By: Awilda Metro      Medications: I have reviewed the patient's current medications.  Impression: 1. Acute respiratory failure, likely multifactorial from COPD exacerbation and possibly diastolic or systolic congestive heart failure. 2. Hypertension. 3. Cerebrovascular disease with history of CVA in the past. 4. Morbid obesity.     Plan: 1. Add intravenous Lasix 40 mg twice a day. Discontinue oral Lasix for the time being. Agree with echocardiogram. 2. Continue with intravenous steroids and bronchodilators.  Consultants:  None.   Procedures:  None.   Antibiotics:  Oral azithromycin started 12/30/2012.                   Code Status: Full code.  Family Communication: Discuss plan with patient at the bedside.   Disposition Plan: Home when medically stable.  Time spent: 20 minutes.   LOS: 0 days   Wilson Singer Pager [phone]  12/30/2012, 8:45 AM

## 2012-12-30 NOTE — Progress Notes (Signed)
UR chart review completed.  

## 2012-12-30 NOTE — H&P (Signed)
PCP:   Milana Obey, MD   Chief Complaint:  Sob and wheezing  HPI: 72 yo female h/o copd on home oxygen at 2.5 L [place] continous, htn, dm comes in with worsening sob and wheezing last night.  No fevers.  Has a nebulizer at home, took one but her sob did not improve.  She still smokes but says she is going to quit.  No n/v/d.  She has received solumedrol along with an hour long alb neb in ED and her breathing has improved but is not back to baseline.  Her son is present.  Her mentation has been normal.  She is feeling better.  Called to admit pt due to hypoxia on RA and tachypnea.  While being moved with ems, she suffered a skin tear to rle.  Review of Systems:  Positive and negative as per HPI otherwise all other systems are negative  Past Medical History: Past Medical History  Diagnosis Date  . Arthritis   . Joint pain   . Stroke   . Ulcer   . COPD (chronic obstructive pulmonary disease)   . Diabetes mellitus   . Carotid artery occlusion   . Hypertension   . Psoriasis   . DVT (deep venous thrombosis)    Past Surgical History  Procedure Laterality Date  . Carotid stent  2012    Medications: Prior to Admission medications   Medication Sig Start Date End Date Taking? Authorizing Provider  albuterol (PROVENTIL HFA;VENTOLIN HFA) 108 (90 BASE) MCG/ACT inhaler Inhale 2 puffs into the lungs every 6 (six) hours as needed.   Yes Historical Provider, MD  barrier cream (NON-SPECIFIED) CREA Apply 1 application topically 2 (two) times daily as needed.     Yes Historical Provider, MD  clobetasol (TEMOVATE) 0.05 % cream Apply topically 2 (two) times daily.     Yes Historical Provider, MD  clopidogrel (PLAVIX) 75 MG tablet Take 75 mg by mouth daily.     Yes Historical Provider, MD  diphenhydrAMINE (SOMINEX) 25 MG tablet Take 25 mg by mouth as needed.     Yes Historical Provider, MD  ferrous sulfate 325 (65 FE) MG tablet Take 325 mg by mouth daily.   Yes Historical Provider, MD   Fluticasone-Salmeterol (ADVAIR DISKUS IN) Inhale 1 puff into the lungs 2 (two) times daily.     Yes Historical Provider, MD  folic acid (FOLVITE) 1 MG tablet Take 1 mg by mouth daily.   Yes Historical Provider, MD  furosemide (LASIX) 40 MG tablet Take 40 mg by mouth daily.     Yes Historical Provider, MD  GARLIC PO Take by mouth daily.   Yes Historical Provider, MD  ipratropium (ATROVENT) 0.02 % nebulizer solution Take 500 mcg by nebulization 4 (four) times daily.     Yes Historical Provider, MD  meclizine (ANTIVERT) 25 MG tablet Take 25 mg by mouth 4 (four) times daily as needed. 1/2 tablet   Yes Historical Provider, MD  methotrexate 2.5 MG tablet Take by mouth. Take 8 tablets every Wenesday   Yes Historical Provider, MD  Omega-3 Fatty Acids (FISH OIL PO) Take by mouth daily.   Yes Historical Provider, MD  potassium chloride SA (K-DUR,KLOR-CON) 20 MEQ tablet Take 20 mEq by mouth daily.   Yes Historical Provider, MD  nicotine (NICODERM CQ - DOSED IN MG/24 HOURS) 21 mg/24hr patch Place 1 patch onto the skin daily.      Historical Provider, MD  potassium chloride 20 MEQ/15ML (10%) solution Take 20 mEq by  mouth daily.      Historical Provider, MD    Allergies:   Allergies  Allergen Reactions  . Aspirin     REACTION: GI upset    Social History:  reports that she has been smoking Cigarettes.  She has been smoking about 0.50 packs per day. She has never used smokeless tobacco. She reports that she does not drink alcohol or use illicit drugs.  Family History: History reviewed. No pertinent family history.  Physical Exam: Filed Vitals:   12/30/12 0420 12/30/12 0443 12/30/12 0500 12/30/12 0542  BP:  108/52 95/66   Pulse: 114 118  118  Temp: 98.1 F (36.7 C)     TempSrc: Oral     Resp: 28 28 29 27   Weight:      SpO2: 100% 92%  93%   General appearance: alert, cooperative and no distress Head: Normocephalic, without obvious abnormality, atraumatic Eyes: negative Nose: Nares normal.  Septum midline. Mucosa normal. No drainage or sinus tenderness. Neck: no JVD and supple, symmetrical, trachea midline Lungs: wheezes bilaterally Heart: regular rate and rhythm, S1, S2 normal, no murmur, click, rub or gallop Abdomen: soft, non-tender; bowel sounds normal; no masses,  no organomegaly Extremities: extremities normal, atraumatic, no cyanosis or edema Pulses: 2+ and symmetric Skin: Skin color, texture, turgor normal. No rashes or lesions skin tear to rle wrapped and bandaged appropriately Neurologic: Grossly normal    Labs on Admission:   Recent Labs  12/30/12 0404  NA 138  K 4.4  CL 101  CO2 30  GLUCOSE 202*  BUN 17  CREATININE 1.20*  CALCIUM 9.3    Recent Labs  12/30/12 0404  WBC 16.8*  NEUTROABS 13.1*  HGB 10.5*  HCT 34.4*  MCV 102.4*  PLT 408*    Radiological Exams on Admission: Dg Chest Port 1 View  12/30/2012   *RADIOLOGY REPORT*  Clinical Data: Oxygen desaturation.  PORTABLE CHEST - 1 VIEW  Comparison: CT of the chest February 23, 2011  Findings: Cardiac silhouette appears moderately enlarged.  Diffuse interstitial prominence with central pulmonary vasculature congestion.  No definite pleural effusions or focal consolidations though, large body habitus limits evaluation.  No pneumothorax though lung apices obscured by facial structures.  The patient appears edentulous.  IMPRESSION: Cardiomegaly with central pulmonary vascular congestion and interstitial prominence favoring moderate pulmonary edema. Consider follow-up PA and lateral views of chest, after treatment  to verify improvement.   Original Report Authenticated By: Awilda Metro    Assessment/Plan  72 yo female with acote on chronic resp failure due to copde Principal Problem:   Acute respiratory failure with hypoxia Active Problems:   Morbid obesity   ANXIETY   CHRONIC OBSTRUCTIVE PULMONARY DISEASE, ACUTE EXACERBATION   Hypertension   On home oxygen therapy  Place on zpack, freq  nebs xoponex due to tachycardia.  Solumedrol.  Oxygen.  Has improved already in ED.  Not quite back to baseline.  Encourage quiting tobacco usage.  Place on tele.  ??component of diastolic chf possible, ck 2D echo.    Tinamarie Przybylski A 12/30/2012, 5:46 AM

## 2012-12-31 DIAGNOSIS — I379 Nonrheumatic pulmonary valve disorder, unspecified: Secondary | ICD-10-CM

## 2012-12-31 LAB — CBC
MCH: 31.6 pg (ref 26.0–34.0)
MCHC: 31.2 g/dL (ref 30.0–36.0)
MCV: 101.2 fL — ABNORMAL HIGH (ref 78.0–100.0)
Platelets: 376 10*3/uL (ref 150–400)
RDW: 15.6 % — ABNORMAL HIGH (ref 11.5–15.5)
WBC: 10.8 10*3/uL — ABNORMAL HIGH (ref 4.0–10.5)

## 2012-12-31 LAB — BASIC METABOLIC PANEL
Calcium: 9.8 mg/dL (ref 8.4–10.5)
Creatinine, Ser: 1.4 mg/dL — ABNORMAL HIGH (ref 0.50–1.10)
GFR calc Af Amer: 43 mL/min — ABNORMAL LOW (ref >90)
Sodium: 138 mEq/L (ref 135–145)

## 2012-12-31 LAB — GLUCOSE, CAPILLARY
Glucose-Capillary: 111 mg/dL — ABNORMAL HIGH (ref 70–99)
Glucose-Capillary: 124 mg/dL — ABNORMAL HIGH (ref 70–99)
Glucose-Capillary: 109 mg/dL — ABNORMAL HIGH (ref 70–99)

## 2012-12-31 LAB — PRO B NATRIURETIC PEPTIDE: Pro B Natriuretic peptide (BNP): 20518 pg/mL — ABNORMAL HIGH (ref 0–125)

## 2012-12-31 MED ORDER — PNEUMOCOCCAL VAC POLYVALENT 25 MCG/0.5ML IJ INJ
0.5000 mL | INJECTION | INTRAMUSCULAR | Status: AC
  Administered 2012-12-31: 0.5 mL via INTRAMUSCULAR
  Filled 2012-12-31: qty 0.5

## 2012-12-31 MED ORDER — INFLUENZA VAC SPLIT QUAD 0.5 ML IM SUSP
0.5000 mL | INTRAMUSCULAR | Status: AC
  Administered 2012-12-31: 0.5 mL via INTRAMUSCULAR
  Filled 2012-12-31: qty 0.5

## 2012-12-31 NOTE — Progress Notes (Signed)
Jill Robinson, Jill Robinson NO.:  [id number]  MEDICAL RECORD NO.:  [id number]  LOCATION:  A303                          FACILITY:  [facility]  PHYSICIAN:  Mila Homer. Sudie Bailey, M.D.DATE OF BIRTH:  [birth year]  DATE OF PROCEDURE: DATE OF DISCHARGE:                                PROGRESS NOTE   SUBJECTIVE:  This patient was admitted to the hospital with COPD.  She is feeling better.  OBJECTIVE:  Temperature is 97.4, pulse 85, respiratory rate 22, blood pressure 123/51.  She is sitting up in a chair.  She is well-developed and morbidly obese.  I can hear scattered expiratory and inspiratory rhonchi throughout the chest, but she is moving air well.  Her heart has a fairly regular rhythm, rate of about 80.  She has scaley changes of the distal legs, but no edema.  Her admission white cell count was 16,800, today it was 10,800.  Her BUN was 28 and creatinine 1.40 today.  Her pro-beta-natriuretic peptide was 20,518 up from 494.  ASSESSMENT: 1. Chronic obstructive pulmonary disease exacerbation. 2. Congestive heart failure. 3. Morbid obesity. 4. Benign essential hypertension. 5. Psoriasis. 6. Status post cerebrovascular accident. 7. Diabetes.  PLAN:  At present, we will continue her on albuterol with ipratropium and levalbuterol, methylprednisolone 125 mg IV q.12 hours, azithromycin 250 mg p.o. daily, and respiratory care.     Mila Homer. Sudie Bailey, M.D.     SDK/MEDQ  D:  12/31/2012  T:  12/31/2012  Job:  [id number]

## 2013-01-01 ENCOUNTER — Inpatient Hospital Stay (HOSPITAL_COMMUNITY): Payer: Medicare Other

## 2013-01-01 LAB — BASIC METABOLIC PANEL
BUN: 38 mg/dL — ABNORMAL HIGH (ref 6–23)
Calcium: 10.2 mg/dL (ref 8.4–10.5)
Chloride: 94 mEq/L — ABNORMAL LOW (ref 96–112)
Creatinine, Ser: 1.15 mg/dL — ABNORMAL HIGH (ref 0.50–1.10)
GFR calc non Af Amer: 47 mL/min — ABNORMAL LOW (ref >90)
Glucose, Bld: 128 mg/dL — ABNORMAL HIGH (ref 70–99)
Sodium: 138 mEq/L (ref 135–145)

## 2013-01-01 LAB — CBC
Hemoglobin: 11 g/dL — ABNORMAL LOW (ref 12.0–15.0)
MCH: 30.7 pg (ref 26.0–34.0)
MCHC: 30.9 g/dL (ref 30.0–36.0)
MCV: 99.4 fL (ref 78.0–100.0)
Platelets: 405 10*3/uL — ABNORMAL HIGH (ref 150–400)
WBC: 10.5 10*3/uL (ref 4.0–10.5)

## 2013-01-01 LAB — GLUCOSE, CAPILLARY
Glucose-Capillary: 126 mg/dL — ABNORMAL HIGH (ref 70–99)
Glucose-Capillary: 162 mg/dL — ABNORMAL HIGH (ref 70–99)

## 2013-01-01 MED ORDER — FUROSEMIDE 40 MG PO TABS
40.0000 mg | ORAL_TABLET | Freq: Every day | ORAL | Status: DC
  Administered 2013-01-01 – 2013-01-02 (×2): 40 mg via ORAL
  Filled 2013-01-01 (×2): qty 1

## 2013-01-01 MED ORDER — PANTOPRAZOLE SODIUM 40 MG PO TBEC
40.0000 mg | DELAYED_RELEASE_TABLET | Freq: Every day | ORAL | Status: DC
  Administered 2013-01-01 – 2013-01-02 (×2): 40 mg via ORAL
  Filled 2013-01-01 (×2): qty 1

## 2013-01-01 MED ORDER — METHYLPREDNISOLONE SODIUM SUCC 125 MG IJ SOLR
125.0000 mg | Freq: Four times a day (QID) | INTRAMUSCULAR | Status: DC
  Administered 2013-01-01 – 2013-01-02 (×5): 125 mg via INTRAVENOUS
  Filled 2013-01-01 (×5): qty 2

## 2013-01-01 NOTE — Progress Notes (Signed)
  Jill Robinson ZOX:[id number] DOB: Oct 17, 1940 DOA: 12/30/2012 PCP: Milana Obey, MD   Subjective: This 72 year old morbidly obese lady was admitted with acute dyspnea which woke her from her sleep. She says she has not done this before. She says that her breathing was okay during the daytime . She does have a history of COPD and is still a smoker, now smoking 4-5 cigarettes per day. She denies any specific cough or fever. She does feel better than when I had seen a couple of days ago. However, she is still not back to her baseline respiratory status. She normally has 2-1/2 L of oxygen per minute at home. She is currently on 3 L of oxygen per minute. She has not mobilized much.           Physical Exam: Blood pressure 145/67, pulse 90, temperature 97.5 F (36.4 C), temperature source Oral, resp. rate 20, height 5\' 3"  (1.6 m), weight 120.6 kg (265 lb 14 oz), SpO2 97.00%. She looks systemically well. She is not toxic or septic. She is morbidly obese. There is no peripheral or central cyanosis. Lung fields show scattered wheezing bilaterally but today cannot hear any crackles.. There is no significant peripheral pitting edema in her legs. Heart sounds are present without gallop rhythm. She is alert and orientated.   Investigations:     Basic Metabolic Panel:  Recent Labs  81/19/14 0504 01/01/13 0552  NA 138 138  K 5.1 4.5  CL 97 94*  CO2 34* 37*  GLUCOSE 125* 128*  BUN 28* 38*  CREATININE 1.40* 1.15*  CALCIUM 9.8 10.2       CBC:  Recent Labs  12/30/12 0404 12/31/12 0504 01/01/13 0552  WBC 16.8* 10.8* 10.5  NEUTROABS 13.1*  --   --   HGB 10.5* 10.3* 11.0*  HCT 34.4* 33.0* 35.6*  MCV 102.4* 101.2* 99.4  PLT 408* 376 405*    Dg Chest Port 1 View  01/01/2013   CLINICAL DATA:  COPD.  EXAM: PORTABLE CHEST - 1 VIEW  COMPARISON:  CT chest 04/25/2010 PA and lateral chest 03/07/2010 and single view of the chest 12/30/2012.  FINDINGS: There is cardiomegaly and  vascular congestion. The lungs are emphysematous. No consolidative process, pneumothorax or effusion.  IMPRESSION: Cardiomegaly and pulmonary vascular congestion.   Electronically Signed   By: Drusilla Kanner M.D.   On: 01/01/2013 07:50      Medications: I have reviewed the patient's current medications.  Impression: 1. Acute respiratory failure, likely multifactorial from COPD exacerbation and possibly diastolic or systolic congestive heart failure. Echocardiogram actually does not show significant diastolic dysfunction. Her ejection fraction was in normal range. Therefore I think that most of her problems are really from COPD. 2. Hypertension. 3. Cerebrovascular disease with history of CVA in the past. 4. Morbid obesity.     Plan: 1. Discontinue IV Lasix and continue with her own home oral Lasix. 2. Increase IV steroids.  Consultants:  None.   Procedures:  None.   Antibiotics:  Oral azithromycin started 12/30/2012.                   Code Status: Full code.  Family Communication: Discuss plan with patient at the bedside.   Disposition Plan: Home when medically stable.  Time spent: 20 minutes.   LOS: 2 days   Wilson Singer Pager [phone]  01/01/2013, 8:13 AM

## 2013-01-02 LAB — GLUCOSE, CAPILLARY: Glucose-Capillary: 151 mg/dL — ABNORMAL HIGH (ref 70–99)

## 2013-01-02 LAB — BASIC METABOLIC PANEL
BUN: 46 mg/dL — ABNORMAL HIGH (ref 6–23)
Calcium: 9.8 mg/dL (ref 8.4–10.5)
Chloride: 94 mEq/L — ABNORMAL LOW (ref 96–112)
Creatinine, Ser: 1.19 mg/dL — ABNORMAL HIGH (ref 0.50–1.10)
GFR calc Af Amer: 52 mL/min — ABNORMAL LOW (ref >90)
GFR calc non Af Amer: 45 mL/min — ABNORMAL LOW (ref >90)
Potassium: 4.2 mEq/L (ref 3.5–5.1)

## 2013-01-02 MED ORDER — PREDNISONE 20 MG PO TABS: ORAL_TABLET | ORAL | Status: DC

## 2013-01-02 MED ORDER — GUAIFENESIN ER 600 MG PO TB12: 600.0000 mg | ORAL_TABLET | Freq: Two times a day (BID) | ORAL | Status: DC

## 2013-01-02 MED ORDER — AZITHROMYCIN 250 MG PO TABS: 250.0000 mg | ORAL_TABLET | Freq: Every day | ORAL | Status: DC

## 2013-01-02 NOTE — Discharge Summary (Signed)
Physician Discharge Summary  Patient ID: Jill Robinson MRN: [id number] DOB/AGE: Apr 17, 1940 72 y.o. Primary Care Physician:KNOWLTON,STEPHEN D, MD Admit date: 12/30/2012 Discharge date: 01/02/2013    Discharge Diagnoses:  1. COPD exacerbation. 2. Hypertension. 3. Morbid obesity. 4. Anxiety.     Medication List         albuterol 108 (90 BASE) MCG/ACT inhaler  Commonly known as:  PROVENTIL HFA;VENTOLIN HFA  Inhale 2 puffs into the lungs every 6 (six) hours as needed for wheezing.     azithromycin 250 MG tablet  Commonly known as:  ZITHROMAX  Take 1 tablet (250 mg total) by mouth daily.     barrier cream Crea  Commonly known as:  non-specified  Apply 1 application topically 2 (two) times daily as needed (psoriasis).     clobetasol cream 0.05 %  Commonly known as:  TEMOVATE  Apply topically 2 (two) times daily.     clopidogrel 75 MG tablet  Commonly known as:  PLAVIX  Take 75 mg by mouth daily.     diphenhydrAMINE 25 MG tablet  Commonly known as:  BENADRYL  Take 25 mg by mouth every 6 (six) hours as needed for itching.     ferrous sulfate 325 (65 FE) MG tablet  Take 325 mg by mouth daily.     FISH OIL PO  Take 1 capsule by mouth daily.     Fluticasone-Salmeterol 250-50 MCG/DOSE Aepb  Commonly known as:  ADVAIR  Inhale 1 puff into the lungs every 12 (twelve) hours.     folic acid 1 MG tablet  Commonly known as:  FOLVITE  Take 1 mg by mouth daily.     furosemide 40 MG tablet  Commonly known as:  LASIX  Take 40 mg by mouth daily.     GARLIC PO  Take 1 tablet by mouth daily.     guaiFENesin 600 MG 12 hr tablet  Commonly known as:  MUCINEX  Take 1 tablet (600 mg total) by mouth 2 (two) times daily.     ipratropium 0.02 % nebulizer solution  Commonly known as:  ATROVENT  Take 500 mcg by nebulization 4 (four) times daily.     meclizine 25 MG tablet  Commonly known as:  ANTIVERT  Take 25 mg by mouth 4 (four) times daily as needed.     methotrexate  2.5 MG tablet  Commonly known as:  RHEUMATREX  Take 20 mg by mouth once a week. On Wednesday. Caution:Chemotherapy. Protect from light.     potassium chloride SA 20 MEQ tablet  Commonly known as:  K-DUR,KLOR-CON  Take 20 mEq by mouth daily.     predniSONE 20 MG tablet  Commonly known as:  DELTASONE  Take 2 tablets every morning for 3 days, then 1 tablet every morning for 3 days, then half a tablet every morning for 3 days, then STOP.        Discharged Condition: Stable and improving.    Consults: None.  Significant Diagnostic Studies: Dg Chest Port 1 View  01/01/2013   CLINICAL DATA:  COPD.  EXAM: PORTABLE CHEST - 1 VIEW  COMPARISON:  CT chest 04/25/2010 PA and lateral chest 03/07/2010 and single view of the chest 12/30/2012.  FINDINGS: There is cardiomegaly and vascular congestion. The lungs are emphysematous. No consolidative process, pneumothorax or effusion.  IMPRESSION: Cardiomegaly and pulmonary vascular congestion.   Electronically Signed   By: Drusilla Kanner M.D.   On: 01/01/2013 07:50   Dg Chest [facility]  12/30/2012   *RADIOLOGY REPORT*  Clinical Data: Oxygen desaturation.  PORTABLE CHEST - 1 VIEW  Comparison: CT of the chest February 23, 2011  Findings: Cardiac silhouette appears moderately enlarged.  Diffuse interstitial prominence with central pulmonary vasculature congestion.  No definite pleural effusions or focal consolidations though, large body habitus limits evaluation.  No pneumothorax though lung apices obscured by facial structures.  The patient appears edentulous.  IMPRESSION: Cardiomegaly with central pulmonary vascular congestion and interstitial prominence favoring moderate pulmonary edema. Consider follow-up PA and lateral views of chest, after treatment  to verify improvement.   Original Report Authenticated By: Awilda Metro    Lab Results: Basic Metabolic Panel:  Recent Labs  16/10/96 0552 01/02/13 0447  NA 138 136  K 4.5 4.2  CL 94* 94*   CO2 37* 33*  GLUCOSE 128* 139*  BUN 38* 46*  CREATININE 1.15* 1.19*  CALCIUM 10.2 9.8   Liver Function Tests: No results found for this basename: AST, ALT, ALKPHOS, BILITOT, PROT, ALBUMIN,  in the last 72 hours   CBC:  Recent Labs  12/31/12 0504 01/01/13 0552  WBC 10.8* 10.5  HGB 10.3* 11.0*  HCT 33.0* 35.6*  MCV 101.2* 99.4  PLT 376 405*    Recent Results (from the past 240 hour(s))  MRSA PCR SCREENING     Status: Abnormal   Collection Time    12/30/12  6:38 AM      Result Value Range Status   MRSA by PCR POSITIVE (*) NEGATIVE Final   Comment:            The GeneXpert MRSA Assay (FDA     approved for NASAL specimens     only), is one component of a     comprehensive MRSA colonization     surveillance program. It is not     intended to diagnose MRSA     infection nor to guide or     monitor treatment for     MRSA infections.     RESULT CALLED TO, READ BACK BY AND VERIFIED WITH:     DICKERSON,M. AT 1016 ON 12/30/2012 BY [facility] Course: This 72 year old lady was admitted to the hospital with symptoms of dyspnea, a relatively sudden onset. Please see initial history as outlined below: HPI:  72 yo female h/o copd on home oxygen at 2.5 L [place] continous, htn, dm comes in with worsening sob and wheezing last night. No fevers. Has a nebulizer at home, took one but her sob did not improve. She still smokes but says she is going to quit. No n/v/d. She has received solumedrol along with an hour long alb neb in ED and her breathing has improved but is not back to baseline. Her son is present. Her mentation has been normal. She is feeling better. Called to admit pt due to hypoxia on RA and tachypnea. While being moved with ems, she suffered a skin tear to rle. The patient was admitted and was treated initially as a combination of COPD exacerbation and heart failure. Echocardiogram was done and showed a normal ejection fraction and not really much diastolic  dysfunction. Therefore, it was felt that her symptoms were due to COPD exacerbation. She was started on intravenous steroids and bronchodilators as well as antibiotics. She made a gradual improvement. She now feels back to her baseline which is using 3 L of oxygen per minute. She is now stable for discharge. She'll be discharged with a tapering course of  prednisone as well as further week or so of antibiotics. She will follow up in the office in a week's time. Discharge Exam: Blood pressure 131/73, pulse 75, temperature 97.8 F (36.6 C), temperature source Oral, resp. rate 20, height 5\' 3"  (1.6 m), weight 119 kg (262 lb 5.6 oz), SpO2 95.00%. She looks systemically well. She is not toxic or septic. There is no peripheral or central cyanosis. There is no increased work of breathing. Lung fields do show some wheezing bilaterally but her chest is not tight and I suspect this is her usual state. She is alert and orientated. Heart sounds are present without murmurs or gallop rhythm.  Disposition: Home.      Discharge Orders   Future Appointments Provider Department Dept Phone   01/20/2013 11:00 AM [facility] [place] [facility] [address] [phone]   01/20/2013 12:00 PM Carma Lair Nickel, NP [facility] [phone]   Future Orders Complete By Expires   Diet - low sodium heart healthy  As directed    Increase activity slowly  As directed       Follow-up Information   Follow up with Milana Obey, MD. Schedule an appointment as soon as possible for a visit in 1 week.   Specialty:  Family Medicine   Contact information:   [address] [place] [address] [phone]       Signed: Wilson Singer Pager [phone]  01/02/2013, 8:25 AM

## 2013-01-02 NOTE — Progress Notes (Signed)
UR chart review completed.  

## 2013-01-13 ENCOUNTER — Ambulatory Visit: Payer: Medicare Other | Admitting: Neurosurgery

## 2013-01-13 ENCOUNTER — Other Ambulatory Visit: Payer: Medicare Other

## 2013-01-15 ENCOUNTER — Encounter (HOSPITAL_COMMUNITY): Payer: Self-pay | Admitting: Emergency Medicine

## 2013-01-15 ENCOUNTER — Inpatient Hospital Stay (HOSPITAL_COMMUNITY)
Admission: EM | Admit: 2013-01-15 | Discharge: 2013-01-20 | DRG: 191 | Disposition: A | Discharge status: Home / Self Care | Payer: Medicare Other | Attending: Family Medicine | Admitting: Family Medicine

## 2013-01-15 ENCOUNTER — Emergency Department (HOSPITAL_COMMUNITY): Payer: Medicare Other

## 2013-01-15 DIAGNOSIS — I5030 Unspecified diastolic (congestive) heart failure: Secondary | ICD-10-CM | POA: Diagnosis present

## 2013-01-15 DIAGNOSIS — L408 Other psoriasis: Secondary | ICD-10-CM | POA: Diagnosis present

## 2013-01-15 DIAGNOSIS — E119 Type 2 diabetes mellitus without complications: Secondary | ICD-10-CM | POA: Diagnosis present

## 2013-01-15 DIAGNOSIS — J449 Chronic obstructive pulmonary disease, unspecified: Secondary | ICD-10-CM

## 2013-01-15 DIAGNOSIS — R030 Elevated blood-pressure reading, without diagnosis of hypertension: Secondary | ICD-10-CM

## 2013-01-15 DIAGNOSIS — F17200 Nicotine dependence, unspecified, uncomplicated: Secondary | ICD-10-CM | POA: Diagnosis present

## 2013-01-15 DIAGNOSIS — N318 Other neuromuscular dysfunction of bladder: Secondary | ICD-10-CM

## 2013-01-15 DIAGNOSIS — I509 Heart failure, unspecified: Secondary | ICD-10-CM | POA: Diagnosis present

## 2013-01-15 DIAGNOSIS — M129 Arthropathy, unspecified: Secondary | ICD-10-CM

## 2013-01-15 DIAGNOSIS — Z87442 Personal history of urinary calculi: Secondary | ICD-10-CM

## 2013-01-15 DIAGNOSIS — Z7902 Long term (current) use of antithrombotics/antiplatelets: Secondary | ICD-10-CM

## 2013-01-15 DIAGNOSIS — J4489 Other specified chronic obstructive pulmonary disease: Secondary | ICD-10-CM

## 2013-01-15 DIAGNOSIS — J441 Chronic obstructive pulmonary disease with (acute) exacerbation: Principal | ICD-10-CM

## 2013-01-15 DIAGNOSIS — J9601 Acute respiratory failure with hypoxia: Secondary | ICD-10-CM

## 2013-01-15 DIAGNOSIS — F411 Generalized anxiety disorder: Secondary | ICD-10-CM

## 2013-01-15 DIAGNOSIS — Z6841 Body Mass Index (BMI) 40.0 and over, adult: Secondary | ICD-10-CM

## 2013-01-15 DIAGNOSIS — I10 Essential (primary) hypertension: Secondary | ICD-10-CM

## 2013-01-15 DIAGNOSIS — R0602 Shortness of breath: Secondary | ICD-10-CM

## 2013-01-15 DIAGNOSIS — L02419 Cutaneous abscess of limb, unspecified: Secondary | ICD-10-CM

## 2013-01-15 DIAGNOSIS — N390 Urinary tract infection, site not specified: Secondary | ICD-10-CM

## 2013-01-15 DIAGNOSIS — Z79899 Other long term (current) drug therapy: Secondary | ICD-10-CM

## 2013-01-15 DIAGNOSIS — Z9981 Dependence on supplemental oxygen: Secondary | ICD-10-CM

## 2013-01-15 LAB — CBC WITH DIFFERENTIAL/PLATELET
HCT: 32.4 % — ABNORMAL LOW (ref 36.0–46.0)
Hemoglobin: 10 g/dL — ABNORMAL LOW (ref 12.0–15.0)
Lymphs Abs: 0.8 10*3/uL (ref 0.7–4.0)
MCH: 30.5 pg (ref 26.0–34.0)
Monocytes Absolute: 0.5 10*3/uL (ref 0.1–1.0)
Monocytes Relative: 5 % (ref 3–12)
Neutro Abs: 7.9 10*3/uL — ABNORMAL HIGH (ref 1.7–7.7)
Neutrophils Relative %: 82 % — ABNORMAL HIGH (ref 43–77)
RBC: 3.28 MIL/uL — ABNORMAL LOW (ref 3.87–5.11)
RDW: 15.8 % — ABNORMAL HIGH (ref 11.5–15.5)

## 2013-01-15 LAB — COMPREHENSIVE METABOLIC PANEL
ALT: 21 U/L (ref 0–35)
AST: 17 U/L (ref 0–37)
Alkaline Phosphatase: 77 U/L (ref 39–117)
CO2: 30 mEq/L (ref 19–32)
GFR calc Af Amer: 45 mL/min — ABNORMAL LOW (ref >90)
GFR calc non Af Amer: 38 mL/min — ABNORMAL LOW (ref >90)
Glucose, Bld: 88 mg/dL (ref 70–99)
Potassium: 4.6 mEq/L (ref 3.5–5.1)
Sodium: 135 mEq/L (ref 135–145)
Total Bilirubin: 0.3 mg/dL (ref 0.3–1.2)
Total Protein: 6.9 g/dL (ref 6.0–8.3)

## 2013-01-15 LAB — PRO B NATRIURETIC PEPTIDE: Pro B Natriuretic peptide (BNP): 2765 pg/mL — ABNORMAL HIGH (ref 0–125)

## 2013-01-15 LAB — TROPONIN I: Troponin I: <0.3 ng/mL (ref <0.30)

## 2013-01-15 MED ORDER — ONDANSETRON HCL 4 MG/2ML IJ SOLN: 4.0000 mg | Freq: Four times a day (QID) | INTRAMUSCULAR | Status: DC | PRN

## 2013-01-15 MED ORDER — ONDANSETRON HCL 4 MG PO TABS: 4.0000 mg | ORAL_TABLET | Freq: Four times a day (QID) | ORAL | Status: DC | PRN

## 2013-01-15 MED ORDER — CLOPIDOGREL BISULFATE 75 MG PO TABS
75.0000 mg | ORAL_TABLET | Freq: Every day | ORAL | Status: DC
  Administered 2013-01-16 – 2013-01-20 (×5): 75 mg via ORAL
  Filled 2013-01-15 (×5): qty 1

## 2013-01-15 MED ORDER — LEVOFLOXACIN IN D5W 500 MG/100ML IV SOLN
INTRAVENOUS | Status: AC
  Filled 2013-01-15: qty 100

## 2013-01-15 MED ORDER — ALBUTEROL SULFATE (5 MG/ML) 0.5% IN NEBU
5.0000 mg | INHALATION_SOLUTION | Freq: Once | RESPIRATORY_TRACT | Status: AC
  Administered 2013-01-15: 5 mg via RESPIRATORY_TRACT
  Filled 2013-01-15: qty 1

## 2013-01-15 MED ORDER — POTASSIUM CHLORIDE CRYS ER 20 MEQ PO TBCR
20.0000 meq | EXTENDED_RELEASE_TABLET | Freq: Every day | ORAL | Status: DC
  Administered 2013-01-16 – 2013-01-20 (×5): 20 meq via ORAL
  Filled 2013-01-15 (×5): qty 1

## 2013-01-15 MED ORDER — SODIUM CHLORIDE 0.9 % IJ SOLN
3.0000 mL | Freq: Two times a day (BID) | INTRAMUSCULAR | Status: DC
  Administered 2013-01-15 – 2013-01-20 (×3): 3 mL via INTRAVENOUS

## 2013-01-15 MED ORDER — MOMETASONE FURO-FORMOTEROL FUM 100-5 MCG/ACT IN AERO
INHALATION_SPRAY | RESPIRATORY_TRACT | Status: AC
  Filled 2013-01-15: qty 8.8

## 2013-01-15 MED ORDER — ACETAMINOPHEN 325 MG PO TABS: 650.0000 mg | ORAL_TABLET | Freq: Four times a day (QID) | ORAL | Status: DC | PRN

## 2013-01-15 MED ORDER — ALBUTEROL SULFATE (5 MG/ML) 0.5% IN NEBU
10.0000 mg | INHALATION_SOLUTION | Freq: Once | RESPIRATORY_TRACT | Status: AC
  Administered 2013-01-15: 10 mg via RESPIRATORY_TRACT
  Filled 2013-01-15: qty 2

## 2013-01-15 MED ORDER — FUROSEMIDE 40 MG PO TABS
40.0000 mg | ORAL_TABLET | Freq: Every day | ORAL | Status: DC
  Administered 2013-01-16 – 2013-01-20 (×5): 40 mg via ORAL
  Filled 2013-01-15: qty 1
  Filled 2013-01-15: qty 2
  Filled 2013-01-15 (×3): qty 1

## 2013-01-15 MED ORDER — HYDROMORPHONE HCL PF 1 MG/ML IJ SOLN
1.0000 mg | Freq: Once | INTRAMUSCULAR | Status: DC
  Filled 2013-01-15: qty 1

## 2013-01-15 MED ORDER — FOLIC ACID 1 MG PO TABS
1.0000 mg | ORAL_TABLET | Freq: Every day | ORAL | Status: DC
  Administered 2013-01-16 – 2013-01-20 (×5): 1 mg via ORAL
  Filled 2013-01-15 (×5): qty 1

## 2013-01-15 MED ORDER — ENOXAPARIN SODIUM 40 MG/0.4ML ~~LOC~~ SOLN
40.0000 mg | SUBCUTANEOUS | Status: DC
  Administered 2013-01-16: 40 mg via SUBCUTANEOUS
  Filled 2013-01-15: qty 0.4

## 2013-01-15 MED ORDER — DIPHENHYDRAMINE HCL 25 MG PO CAPS
25.0000 mg | ORAL_CAPSULE | Freq: Four times a day (QID) | ORAL | Status: DC | PRN
  Filled 2013-01-15: qty 1

## 2013-01-15 MED ORDER — MOMETASONE FURO-FORMOTEROL FUM 100-5 MCG/ACT IN AERO
2.0000 | INHALATION_SPRAY | Freq: Two times a day (BID) | RESPIRATORY_TRACT | Status: DC
  Administered 2013-01-16 – 2013-01-20 (×9): 2 via RESPIRATORY_TRACT
  Filled 2013-01-15: qty 8.8

## 2013-01-15 MED ORDER — SODIUM CHLORIDE 0.9 % IJ SOLN: 3.0000 mL | INTRAMUSCULAR | Status: DC | PRN

## 2013-01-15 MED ORDER — SODIUM CHLORIDE 0.9 % IV SOLN: 250.0000 mL | INTRAVENOUS | Status: DC | PRN

## 2013-01-15 MED ORDER — FERROUS SULFATE 325 (65 FE) MG PO TABS
325.0000 mg | ORAL_TABLET | Freq: Every day | ORAL | Status: DC
  Administered 2013-01-16 – 2013-01-20 (×5): 325 mg via ORAL
  Filled 2013-01-15 (×5): qty 1

## 2013-01-15 MED ORDER — GUAIFENESIN ER 600 MG PO TB12
600.0000 mg | ORAL_TABLET | Freq: Two times a day (BID) | ORAL | Status: DC
  Administered 2013-01-15 – 2013-01-20 (×10): 600 mg via ORAL
  Filled 2013-01-15 (×10): qty 1

## 2013-01-15 MED ORDER — HYDROCODONE-ACETAMINOPHEN 5-325 MG PO TABS
1.0000 | ORAL_TABLET | ORAL | Status: DC | PRN
  Administered 2013-01-17: 1 via ORAL
  Filled 2013-01-15: qty 1

## 2013-01-15 MED ORDER — ACETAMINOPHEN 650 MG RE SUPP: 650.0000 mg | Freq: Four times a day (QID) | RECTAL | Status: DC | PRN

## 2013-01-15 MED ORDER — METHYLPREDNISOLONE SODIUM SUCC 125 MG IJ SOLR
125.0000 mg | Freq: Four times a day (QID) | INTRAMUSCULAR | Status: DC
  Administered 2013-01-15 – 2013-01-17 (×6): 125 mg via INTRAVENOUS
  Filled 2013-01-15 (×6): qty 2

## 2013-01-15 MED ORDER — MECLIZINE HCL 12.5 MG PO TABS: 25.0000 mg | ORAL_TABLET | Freq: Four times a day (QID) | ORAL | Status: DC | PRN

## 2013-01-15 MED ORDER — IPRATROPIUM BROMIDE 0.02 % IN SOLN
0.5000 mg | Freq: Once | RESPIRATORY_TRACT | Status: AC
  Administered 2013-01-15: 0.5 mg via RESPIRATORY_TRACT
  Filled 2013-01-15: qty 2.5

## 2013-01-15 MED ORDER — OMEGA-3-ACID ETHYL ESTERS 1 G PO CAPS
1.0000 g | ORAL_CAPSULE | Freq: Two times a day (BID) | ORAL | Status: DC
  Administered 2013-01-15 – 2013-01-20 (×10): 1 g via ORAL
  Filled 2013-01-15 (×10): qty 1

## 2013-01-15 MED ORDER — PREDNISONE 50 MG PO TABS
60.0000 mg | ORAL_TABLET | Freq: Once | ORAL | Status: AC
  Administered 2013-01-15: 60 mg via ORAL
  Filled 2013-01-15 (×2): qty 1

## 2013-01-15 MED ORDER — IPRATROPIUM BROMIDE 0.02 % IN SOLN
0.5000 mg | Freq: Four times a day (QID) | RESPIRATORY_TRACT | Status: DC
  Administered 2013-01-15 – 2013-01-20 (×17): 0.5 mg via RESPIRATORY_TRACT
  Filled 2013-01-15 (×17): qty 2.5

## 2013-01-15 MED ORDER — LEVOFLOXACIN IN D5W 500 MG/100ML IV SOLN
500.0000 mg | INTRAVENOUS | Status: DC
  Administered 2013-01-15 – 2013-01-16 (×2): 500 mg via INTRAVENOUS
  Filled 2013-01-15 (×2): qty 100

## 2013-01-15 MED ORDER — ALUM & MAG HYDROXIDE-SIMETH 200-200-20 MG/5ML PO SUSP: 30.0000 mL | Freq: Four times a day (QID) | ORAL | Status: DC | PRN

## 2013-01-15 MED ORDER — ALBUTEROL SULFATE (5 MG/ML) 0.5% IN NEBU: 2.5000 mg | INHALATION_SOLUTION | RESPIRATORY_TRACT | Status: AC | PRN

## 2013-01-15 MED ORDER — ALBUTEROL SULFATE (5 MG/ML) 0.5% IN NEBU
2.5000 mg | INHALATION_SOLUTION | Freq: Four times a day (QID) | RESPIRATORY_TRACT | Status: DC
  Administered 2013-01-15 – 2013-01-20 (×17): 2.5 mg via RESPIRATORY_TRACT
  Filled 2013-01-15 (×16): qty 0.5

## 2013-01-15 NOTE — ED Notes (Signed)
Pt c/o SOB, productive cough with clear phlegm, and fever x 3 days.  Also c/o abd pain and vomited x 1 yesterday.  Pt took tylenol this am for fever.

## 2013-01-15 NOTE — ED Provider Notes (Signed)
CSN: [id number]     Arrival date & time 01/15/13  1825 History   First MD Initiated Contact with Patient 01/15/13 1841    Scribed for No att. providers found, the patient was seen in room A312/A312-01. This chart was scribed by Lewanda Rife, ED scribe. Patient's care was started at 9:15 PM  Chief Complaint  Patient presents with  . Shortness of Breath   (Consider location/radiation/quality/duration/timing/severity/associated sxs/prior Treatment) The history is provided by the patient and medical records. No language interpreter was used.   HPI Comments: Jill Robinson is a 72 y.o. female who presents to the Emergency Department with PMHx of DM, smoking, and COPD complaining of worsening constant shortness of breath onset 3 days. Reports associated intermittent productive cough with clear sputum, diffuse mild abdominal pain (onset yesterday), and 1 episode of emesis (yesterday). Denies associated fever. Denies any aggravating factors. Reports inhalers mildly improved symptoms. Reports having the flu shot and pneumonia shot. Reports last COPD flare up was last week and d/c 01/09/13 . Reports she is on 2.5 L of oxygen at home.Reports she finished abx and prednisone.  Past Medical History  Diagnosis Date  . Arthritis   . Joint pain   . Stroke   . Ulcer   . COPD (chronic obstructive pulmonary disease)   . Diabetes mellitus   . Carotid artery occlusion   . Hypertension   . Psoriasis   . DVT (deep venous thrombosis)    Past Surgical History  Procedure Laterality Date  . Carotid stent  2012   No family history on file. History  Substance Use Topics  . Smoking status: Current Every Day Smoker -- 0.50 packs/day    Types: Cigarettes  . Smokeless tobacco: Former Neurosurgeon    Quit date: 12/30/2012     Comment: pt states that she is trying to quit  . Alcohol Use: No   OB History   Grav Para Term Preterm Abortions TAB SAB Ect Mult Living                 Review of Systems   Respiratory: Positive for shortness of breath.   All other systems reviewed and are negative.  10 Systems reviewed and all are negative for acute change except as noted in the HPI.    Allergies  Aspirin  Home Medications   Current Outpatient Rx  Name  Route  Sig  Dispense  Refill  . albuterol (PROVENTIL HFA;VENTOLIN HFA) 108 (90 BASE) MCG/ACT inhaler   Inhalation   Inhale 2 puffs into the lungs every 6 (six) hours as needed for wheezing.         . barrier cream (NON-SPECIFIED) CREA   Topical   Apply 1 application topically 2 (two) times daily as needed (psoriasis).         . clobetasol (TEMOVATE) 0.05 % cream   Topical   Apply topically 2 (two) times daily.           . clopidogrel (PLAVIX) 75 MG tablet   Oral   Take 75 mg by mouth daily.           . diphenhydrAMINE (BENADRYL) 25 MG tablet   Oral   Take 25 mg by mouth every 6 (six) hours as needed for itching.         . ferrous sulfate 325 (65 FE) MG tablet   Oral   Take 325 mg by mouth daily.         . Fluticasone-Salmeterol (ADVAIR) 250-50  MCG/DOSE AEPB   Inhalation   Inhale 1 puff into the lungs every 12 (twelve) hours.         . folic acid (FOLVITE) 1 MG tablet   Oral   Take 1 mg by mouth daily.         . furosemide (LASIX) 40 MG tablet   Oral   Take 40 mg by mouth daily.           Marland Kitchen GARLIC PO   Oral   Take 1 tablet by mouth daily.         Marland Kitchen guaiFENesin (MUCINEX) 600 MG 12 hr tablet   Oral   Take 1 tablet (600 mg total) by mouth 2 (two) times daily.   30 tablet   1   . ipratropium (ATROVENT) 0.02 % nebulizer solution   Nebulization   Take 500 mcg by nebulization 4 (four) times daily.          . meclizine (ANTIVERT) 25 MG tablet   Oral   Take 25 mg by mouth 4 (four) times daily as needed for dizziness.          . methotrexate (RHEUMATREX) 2.5 MG tablet   Oral   Take 20 mg by mouth once a week. On Wednesday. Caution:Chemotherapy. Protect from light.         . Omega-3  Fatty Acids (FISH OIL PO)   Oral   Take 1 capsule by mouth daily.         . potassium chloride SA (K-DUR,KLOR-CON) 20 MEQ tablet   Oral   Take 20 mEq by mouth daily.         Marland Kitchen levofloxacin (LEVAQUIN) 500 MG tablet   Oral   Take 1 tablet (500 mg total) by mouth daily at 6 PM.   7 tablet   0   . predniSONE (DELTASONE) 20 MG tablet      Take 2 tablets every morning for 3 days, then 1 tablet every morning for 3 days, then half a tablet every morning for 3 days, then STOP.   12 tablet   0    BP 125/43  Pulse 83  Temp(Src) 97.8 F (36.6 C) (Oral)  Resp 20  Ht 5\' 3"  (1.6 m)  Wt 253 lb 15.5 oz (115.2 kg)  BMI 45.00 kg/m2  SpO2 93% Physical Exam  Nursing note and vitals reviewed. Constitutional:  Awake, alert, nontoxic appearance.  HENT:  Head: Atraumatic.  Eyes: Right eye exhibits no discharge. Left eye exhibits no discharge.  Neck: Neck supple.  Cardiovascular: Normal rate and regular rhythm.   No murmur heard. Pulmonary/Chest: Tachypnea noted. No respiratory distress. She has wheezes. She exhibits no tenderness.  Diffuse expiratory wheezes and tachypnea.  Bibasilar crackles, and speaks phrases.  Abdominal: Soft. Bowel sounds are normal. There is no tenderness. There is no rebound.  Musculoskeletal: She exhibits no tenderness.  Baseline ROM, no obvious new focal weakness.  Neurological:  Mental status and motor strength appears baseline for patient and situation.  Skin: No rash noted.  Chronic stasis dermatitis both legs   Psychiatric: She has a normal mood and affect.    ED Course  Procedures (including critical care time) DIAGNOSTIC STUDIES: Oxygen Saturation is 97% on 2.5 L [place], normal by my interpretation.     COORDINATION OF CARE:  Nursing notes reviewed. Vital signs reviewed. Initial pt interview and examination performed.   9:15 PM-Discussed work up plan with pt at bedside, which includes lipase, CXR, BNP, CBC with diff  panel, troponin, CMP, Lipase,  and EKG. Pt agrees with plan.  8:45 PM Nursing Notes Reviewed/ Care Coordinated Applicable Imaging Reviewed  Interpretation of Laboratory Data incorporated into ED treatment Discussed results and treatment plan with pt and family. Pt and family demonstrate understanding. Added UA to work up plan.   9:15 PM Reports recurrent abdominal pain. Pt is still has a non-tender abdominal exam. Pt still has scattered mild bibasilar rales, but significantly improved. Pt stable in ED with no significant deterioration in condition. Pt states she is not comfortable going home because of her dyspnea.  9:15 PM Consulted with Dr. Juanetta Gosling and pt will be admitted for COPD exacerbation.     Treatment plan initiated: Medications  albuterol (PROVENTIL) (5 MG/ML) 0.5% nebulizer solution 2.5 mg (not administered)  predniSONE (DELTASONE) tablet 60 mg (60 mg Oral Given 01/15/13 1906)  albuterol (PROVENTIL) (5 MG/ML) 0.5% nebulizer solution 10 mg (10 mg Nebulization Given 01/15/13 1918)  ipratropium (ATROVENT) nebulizer solution 0.5 mg (0.5 mg Nebulization Given 01/15/13 1918)  albuterol (PROVENTIL) (5 MG/ML) 0.5% nebulizer solution 5 mg (5 mg Nebulization Given 01/15/13 2140)     Initial diagnostic testing ordered.    Labs Review Labs Reviewed  PRO B NATRIURETIC PEPTIDE - Abnormal; Notable for the following:    Pro B Natriuretic peptide (BNP) 2765.0 (*)    All other components within normal limits  CBC WITH DIFFERENTIAL - Abnormal; Notable for the following:    RBC 3.28 (*)    Hemoglobin 10.0 (*)    HCT 32.4 (*)    RDW 15.8 (*)    Platelets 137 (*)    Neutrophils Relative % 82 (*)    Neutro Abs 7.9 (*)    Lymphocytes Relative 8 (*)    All other components within normal limits  COMPREHENSIVE METABOLIC PANEL - Abnormal; Notable for the following:    Creatinine, Ser 1.36 (*)    Albumin 3.3 (*)    GFR calc non Af Amer 38 (*)    GFR calc Af Amer 45 (*)    All other components within normal limits   URINALYSIS, ROUTINE W REFLEX MICROSCOPIC - Abnormal; Notable for the following:    Hgb urine dipstick TRACE (*)    Protein, ur 30 (*)    Leukocytes, UA SMALL (*)    All other components within normal limits  BASIC METABOLIC PANEL - Abnormal; Notable for the following:    Glucose, Bld 121 (*)    Creatinine, Ser 1.30 (*)    GFR calc non Af Amer 41 (*)    GFR calc Af Amer 47 (*)    All other components within normal limits  CBC - Abnormal; Notable for the following:    RBC 3.17 (*)    Hemoglobin 9.5 (*)    HCT 30.9 (*)    Platelets 122 (*)    All other components within normal limits  GLUCOSE, CAPILLARY - Abnormal; Notable for the following:    Glucose-Capillary 108 (*)    All other components within normal limits  URINE MICROSCOPIC-ADD ON - Abnormal; Notable for the following:    Squamous Epithelial / LPF FEW (*)    Bacteria, UA MANY (*)    All other components within normal limits  GLUCOSE, CAPILLARY - Abnormal; Notable for the following:    Glucose-Capillary 119 (*)    All other components within normal limits  GLUCOSE, CAPILLARY - Abnormal; Notable for the following:    Glucose-Capillary 154 (*)    All other components within  normal limits  GLUCOSE, CAPILLARY - Abnormal; Notable for the following:    Glucose-Capillary 243 (*)    All other components within normal limits  GLUCOSE, CAPILLARY - Abnormal; Notable for the following:    Glucose-Capillary 221 (*)    All other components within normal limits  GLUCOSE, CAPILLARY - Abnormal; Notable for the following:    Glucose-Capillary 150 (*)    All other components within normal limits  GLUCOSE, CAPILLARY - Abnormal; Notable for the following:    Glucose-Capillary 148 (*)    All other components within normal limits  GLUCOSE, CAPILLARY - Abnormal; Notable for the following:    Glucose-Capillary 142 (*)    All other components within normal limits  GLUCOSE, CAPILLARY - Abnormal; Notable for the following:     Glucose-Capillary 155 (*)    All other components within normal limits  GLUCOSE, CAPILLARY - Abnormal; Notable for the following:    Glucose-Capillary 117 (*)    All other components within normal limits  GLUCOSE, CAPILLARY - Abnormal; Notable for the following:    Glucose-Capillary 112 (*)    All other components within normal limits  GLUCOSE, CAPILLARY - Abnormal; Notable for the following:    Glucose-Capillary 105 (*)    All other components within normal limits  GLUCOSE, CAPILLARY - Abnormal; Notable for the following:    Glucose-Capillary 110 (*)    All other components within normal limits  GLUCOSE, CAPILLARY - Abnormal; Notable for the following:    Glucose-Capillary 128 (*)    All other components within normal limits  URINE CULTURE  TROPONIN I  LIPASE, BLOOD  GLUCOSE, CAPILLARY  GLUCOSE, CAPILLARY  GLUCOSE, CAPILLARY  GLUCOSE, CAPILLARY   Imaging Review No results found.  EKG Interpretation     Ventricular Rate:  92 PR Interval:  160 QRS Duration: 92 QT Interval:  334 QTC Calculation: 413 R Axis:   65 Text Interpretation:  Normal sinus rhythm with sinus arrhythmia Nonspecific T wave abnormality When compared with ECG of 30-Dec-2012 05:27, Non-specific change in ST segment in Inferior leads Nonspecific T wave abnormality now evident in Lateral leads            MDM   1. COPD exacerbation   2. On home oxygen therapy   3. Morbid obesity   4. ANXIETY   5. CHRONIC OBSTRUCTIVE PULMONARY DISEASE, ACUTE EXACERBATION   6. COPD   7. OVERACTIVE BLADDER   8. UTI'S, RECURRENT   9. CELLULITIS, ANKLE, RIGHT   10. ARTHRITIS   11. DYSPNEA   12. ELEVATED BLOOD PRESSURE   13. RENAL CALCULUS, HX OF   14. Acute respiratory failure with hypoxia   15. Hypertension    The patient appears reasonably stabilized for admission considering the current resources, flow, and capabilities available in the ED at this time, and I doubt any other [facility] requiring further screening  and/or treatment in the ED prior to admission.  I personally performed the services described in this documentation, which was scribed in my presence. The recorded information has been reviewed and is accurate.    Hurman Horn, MD 01/21/13 2116

## 2013-01-16 LAB — URINALYSIS, ROUTINE W REFLEX MICROSCOPIC
Bilirubin Urine: NEGATIVE
Ketones, ur: NEGATIVE mg/dL
Protein, ur: 30 mg/dL — AB
Specific Gravity, Urine: 1.025 (ref 1.005–1.030)
Urobilinogen, UA: 0.2 mg/dL (ref 0.0–1.0)

## 2013-01-16 LAB — BASIC METABOLIC PANEL
CO2: 26 mEq/L (ref 19–32)
Calcium: 9 mg/dL (ref 8.4–10.5)
GFR calc non Af Amer: 41 mL/min — ABNORMAL LOW (ref >90)
Glucose, Bld: 121 mg/dL — ABNORMAL HIGH (ref 70–99)
Potassium: 5 mEq/L (ref 3.5–5.1)
Sodium: 137 mEq/L (ref 135–145)

## 2013-01-16 LAB — GLUCOSE, CAPILLARY
Glucose-Capillary: 108 mg/dL — ABNORMAL HIGH (ref 70–99)
Glucose-Capillary: 119 mg/dL — ABNORMAL HIGH (ref 70–99)
Glucose-Capillary: 154 mg/dL — ABNORMAL HIGH (ref 70–99)
Glucose-Capillary: 243 mg/dL — ABNORMAL HIGH (ref 70–99)

## 2013-01-16 LAB — CBC
HCT: 30.9 % — ABNORMAL LOW (ref 36.0–46.0)
Hemoglobin: 9.5 g/dL — ABNORMAL LOW (ref 12.0–15.0)
MCH: 30 pg (ref 26.0–34.0)
Platelets: 122 10*3/uL — ABNORMAL LOW (ref 150–400)
RBC: 3.17 MIL/uL — ABNORMAL LOW (ref 3.87–5.11)
WBC: 5.6 10*3/uL (ref 4.0–10.5)

## 2013-01-16 LAB — URINE MICROSCOPIC-ADD ON

## 2013-01-16 MED ORDER — BIOTENE DRY MOUTH MT LIQD
15.0000 mL | Freq: Two times a day (BID) | OROMUCOSAL | Status: DC
  Administered 2013-01-16 – 2013-01-20 (×9): 15 mL via OROMUCOSAL

## 2013-01-16 NOTE — Progress Notes (Signed)
Utilization review completed.  

## 2013-01-16 NOTE — Care Management Note (Addendum)
    Page 1 of 1   01/20/2013     9:23:57 AM   CARE MANAGEMENT NOTE 01/20/2013  Patient:  Jill Robinson, Jill Robinson   Account Number:  [id number]  Date Initiated:  01/16/2013  Documentation initiated by:  Rosemary Holms  Subjective/Objective Assessment:   Pt readmitted with COPD. Has O2 at home with walker and BSC. Lives with her son and family. Declines HH     Action/Plan:   Anticipated DC Date:  01/18/2013   Anticipated DC Plan:  HOME/SELF CARE      DC Planning Services  CM consult      Choice offered to / List presented to:             Status of service:  Completed, signed off Medicare Important Message given?  YES (If response is "NO", the following Medicare IM given date fields will be blank) Date Medicare IM given:  01/20/2013 Date Additional Medicare IM given:    Discharge Disposition:  HOME/SELF CARE  Per UR Regulation:    If discussed at Long Length of Stay Meetings, dates discussed:    Comments:  01/20/13 8657 Arlyss Queen, RN BSN CM pt discharged home today. Pt still refuses HH. Has home O2. No CM needs noted.  01/16/13 Amy Robson RN BS N CM

## 2013-01-17 ENCOUNTER — Encounter: Payer: Self-pay | Admitting: Family

## 2013-01-17 LAB — GLUCOSE, CAPILLARY
Glucose-Capillary: 150 mg/dL — ABNORMAL HIGH (ref 70–99)
Glucose-Capillary: 148 mg/dL — ABNORMAL HIGH (ref 70–99)
Glucose-Capillary: 155 mg/dL — ABNORMAL HIGH (ref 70–99)

## 2013-01-17 LAB — URINE CULTURE: Colony Count: >=100000

## 2013-01-17 MED ORDER — ENOXAPARIN SODIUM 60 MG/0.6ML ~~LOC~~ SOLN
60.0000 mg | SUBCUTANEOUS | Status: DC
  Administered 2013-01-17 – 2013-01-20 (×4): 60 mg via SUBCUTANEOUS
  Filled 2013-01-17 (×4): qty 0.6

## 2013-01-17 MED ORDER — INSULIN ASPART 100 UNIT/ML ~~LOC~~ SOLN: 0.0000 [IU] | Freq: Every day | SUBCUTANEOUS | Status: DC

## 2013-01-17 MED ORDER — LEVOFLOXACIN 500 MG PO TABS
500.0000 mg | ORAL_TABLET | Freq: Every day | ORAL | Status: DC
  Administered 2013-01-17 – 2013-01-19 (×3): 500 mg via ORAL
  Filled 2013-01-17 (×3): qty 1

## 2013-01-17 MED ORDER — INSULIN ASPART 100 UNIT/ML ~~LOC~~ SOLN
0.0000 [IU] | Freq: Three times a day (TID) | SUBCUTANEOUS | Status: DC
  Administered 2013-01-17: 2 [IU] via SUBCUTANEOUS
  Administered 2013-01-17: 1 [IU] via SUBCUTANEOUS

## 2013-01-17 MED ORDER — PREDNISONE 20 MG PO TABS
20.0000 mg | ORAL_TABLET | Freq: Two times a day (BID) | ORAL | Status: DC
  Administered 2013-01-17 – 2013-01-20 (×7): 20 mg via ORAL
  Filled 2013-01-17 (×7): qty 1

## 2013-01-17 NOTE — Progress Notes (Signed)
Inpatient Diabetes Program Recommendations  AACE/ADA: New Consensus Statement on Inpatient Glycemic Control  Target Ranges:  Prepandial:   less than 140 mg/dL      Peak postprandial:   less than 180 mg/dL (1-2 hours)      Critically ill patients:  140 - 180 mg/dL  Pager:  [phone] Hours:  8 am-10pm   Reason for Visit: Steroid induced Hyperglycemia  Inpatient Diabetes Program Recommendations Correction (SSI): Add Novolog Correction    Alfredia Client PhD, RN, BC-ADM Diabetes Coordinator  Office:  [phone] Team Pager:  [phone]

## 2013-01-17 NOTE — Progress Notes (Signed)
RN called Dr. Sudie Bailey at his office.  Patient has hx of Diabetes.  CBGs have been checked.  Dr. Sudie Bailey notified of patient's blood glucose levels.  Notified that patient currently does not have insulin ordered.  Dr. Sudie Bailey gave order for patient to receive Novolog sliding scale insulin sensitive scale with bedtime coverage.  Orders followed.

## 2013-01-17 NOTE — H&P (Signed)
[name], COIN NO.:  [id number]  MEDICAL RECORD NO.:  [id number]  LOCATION:  A314                          FACILITY:  [facility]  PHYSICIAN:  Mila Homer. Sudie Bailey, M.D.DATE OF BIRTH:  12-Jun-1940  DATE OF ADMISSION:  01/15/2013 DATE OF DISCHARGE:  LH                             HISTORY & PHYSICAL   SUBJECTIVE:  This 72 year old woman developed shortness of breath starting about 3 days before admission.  Shortness of breath became severe so she came to [facility] Emergency Room for evaluation.  She does have a history of COPD, diabetes, obesity, hypertension, psoriasis, and other issues.  She still smokes half a pack of cigarettes a day.  She was hospitalized here about 3 weeks ago.  CURRENT MEDICATIONS: 1. Albuterol inhaler. 2. Plavix 75 mg daily. 3. Diphenhydramine 25 mg q.6 hours for itching. 4. Ferrous sulfate 325 mg daily. 5. Advair 250/50 puff every 12 hours. 6. Dulera 100/5 mcg 2 puffs b.i.d. 7. Folic acid 1 mg daily. 8. Furosemide 40 mg daily. 9. Guaifenesin 600 mg b.i.d. 10.Ipratropium by nebulizer. 11.Meclizine 25 mg q.i.d. for dizziness. 12.Methotrexate 2.5 mg 8 a week for psoriasis. 13.Omega-3 fatty acid capsule daily. 14.Potassium chloride 20 mEq daily. 15.More recently tapered prednisone course.  She does have a history of MRSA.  ADMISSION PHYSICAL EXAMINATION:  GENERAL:  A pleasant, morbidly obese, white female, who was dyspneic.  At the time of my exam, she was sitting up in bed.  Her son was with her.  Her speech appeared to be normal and her sensorium was intact. LUNGS:  Her lungs showed decreased breath sounds throughout. HEART:  Regular rhythm.  Rate of about 80.  Heart sounds were very distant.  She had a left radial pulse, which was 70. ABDOMEN:  Soft and morbidly obese without organomegaly or mass or tenderness. EXTREMITIES:  She had at least 1+ edema of the distal legs, and also she had a 2-inch long area  of skin laceration and grade 1 ulceration in the distal left anterior lower leg.  LABORATORY DATA:  Her admission white cell count was 9600, recheck 5600. Her admission hemoglobin was 10.0, recheck 9.5.  She had 82% neutrophils.  Chemistry showed BUN 20 with creatinine 1.36, which changed to 22 and 1.30 after hospitalization.  Her pro-beta natriuretic peptide was 2765 and troponin was less than 0.30.  Sugars were 80 and 121.  Her urinalysis showed 3 to 6 WBCs with 3 to 6 RBCs per HPF and many bacteria.  There were small leukocytes, negative nitrite.  Urine culture is pending.  Her admission chest x-ray showed a stable cardiomegaly, but there was no active process.  EKG showed normal sinus rhythm with a rate of 92.  There were nonspecific changes in the lateral leads and inferior leads.  ADMISSION DIAGNOSES: 1. Chronic obstructive pulmonary disease exacerbation. 2. Chronic obstructive pulmonary disease. 3. Morbid obesity. 4. Diabetes. 5. Benign essential hypertension. 6. Psoriasis. 7. CHF  She will be continued on all of the meds noted above.  In addition, she will be on Levaquin 5 mg IV q.24 hours, Solu-Medrol 125 mg IV q.6 hours, and neb treatments.  Hopefully, she  will be much improved within several days.     Mila Homer. Sudie Bailey, M.D.     SDK/MEDQ  D:  01/16/2013  T:  01/17/2013  Job:  [id number]

## 2013-01-17 NOTE — Evaluation (Signed)
Physical Therapy Evaluation Patient Details Name: Jill Robinson MRN: [id number] DOB: [birth year] Today's Date: 01/17/2013 Time: 4098-1191 PT Time Calculation (min): 27 min  PT Assessment / Plan / Recommendation History of Present Illness  Pt is admitted with acute respiratory failure due to COPD.  She is morbidly obese and lives with her family, daughter is her CG.  She normally ambulates with a walker very limited distances in the home.  Clinical Impression   Pt is seen for evaluation.  She is very pleasant and cooperative, reports feeling better since admission.  She is on 2.5 L O2/min which is her norm, O2 sat=98%.  She is functionally at her baseline and able to ambulate 30' with a stable gait pattern using a walker.  She was very dyspneic after gait, but O2 sat =95%.  She was instructed in pursed lip breathing  To assist with expelling CO2.    PT Assessment  Patent does not need any further PT services    Follow Up Recommendations  No PT follow up    Does the patient have the potential to tolerate intense rehabilitation      Barriers to Discharge        Equipment Recommendations  None recommended by PT    Recommendations for Other Services     Frequency      Precautions / Restrictions Precautions Precaution Comments: orange contact precautions, severe psoriasis Restrictions Weight Bearing Restrictions: No   Pertinent Vitals/Pain       Mobility  Bed Mobility Bed Mobility: Supine to Sit Supine to Sit: 6: Modified independent (Device/Increase time);HOB elevated Transfers Transfers: Sit to Stand;Stand to Sit Sit to Stand: 6: Modified independent (Device/Increase time);With upper extremity assist;From bed Stand to Sit: With upper extremity assist;To chair/3-in-1 Ambulation/Gait Ambulation/Gait Assistance: 6: Modified independent (Device/Increase time) Ambulation Distance (Feet): 30 Feet Assistive device: Rolling walker Gait Pattern: Within Functional  Limits Gait velocity: WNL Stairs: No Wheelchair Mobility Wheelchair Mobility: No    Exercises     PT Diagnosis:    PT Problem List:   PT Treatment Interventions:       PT Goals(Current goals can be found in the care plan section) Acute Rehab PT Goals PT Goal Formulation: No goals set, d/c therapy  Visit Information  Last PT Received On: 01/17/13 History of Present Illness: Pt is admitted with acute respiratory failure due to COPD.  She is morbidly obese and lives with her family, daughter is her CG.  She normally ambulates with a walker very limited distances in the home.       Prior Functioning  Home Living Family/patient expects to be discharged to:: Private residence Living Arrangements: Children Available Help at Discharge: Family;Available 24 hours/day Type of Home: House Home Access: Ramped entrance Home Layout: One level Home Equipment: Walker - 2 wheels;Cane - single point;Bedside commode;Shower seat;Wheelchair - manual;Hospital bed Prior Function Level of Independence: Needs assistance Gait / Transfers Assistance Needed: pt ambulates independently, needs assist to transfer into the shower ADL's / Homemaking Assistance Needed: needs assist with dressing at times, unable to do any household ADLs Communication Communication: No difficulties    Cognition  Cognition Arousal/Alertness: Awake/alert Overall Cognitive Status: Within Functional Limits for tasks assessed    Extremity/Trunk Assessment Lower Extremity Assessment Lower Extremity Assessment: Overall WFL for tasks assessed   Balance Balance Balance Assessed: No (WNL by functional observation)  End of Session PT - End of Session Equipment Utilized During Treatment: Gait belt Activity Tolerance: Patient tolerated treatment well Patient left:  in chair;with call bell/phone within reach  GP     Konrad Penta 01/17/2013, 12:03 PM

## 2013-01-17 NOTE — Progress Notes (Signed)
PHARMACIST - PHYSICIAN COMMUNICATION DR:   [place] CONCERNING: Antibiotic IV to Oral Route Change Policy  RECOMMENDATION: This patient is receiving Levaquin by the intravenous route.  Based on criteria approved by the Pharmacy and Therapeutics Committee, the antibiotic(s) is/are being converted to the equivalent oral dose form(s).  DESCRIPTION: These criteria include:  Patient being treated for a respiratory tract infection, urinary tract infection, cellulitis or clostridium difficile associated diarrhea if on metronidazole  The patient is not neutropenic and does not exhibit a GI malabsorption state  The patient is eating (either orally or via tube) and/or has been taking other orally administered medications for a least 24 hours  The patient is improving clinically and has a Tmax < 100.5  If you have questions about this conversion, please contact the Pharmacy Department  [x]  ( [phone] )  [place] []  ( [phone] )  [place]  []  ( [phone] )  [facility] []  ( [phone] )  [facility]   S. Patrice Moates, PharmD  

## 2013-01-18 LAB — GLUCOSE, CAPILLARY: Glucose-Capillary: 88 mg/dL (ref 70–99)

## 2013-01-18 NOTE — Progress Notes (Signed)
Jill Robinson, HENAULT NO.:  [id number]  MEDICAL RECORD NO.:  [id number]  LOCATION:  A314                          FACILITY:  [facility]  PHYSICIAN:  Mila Homer. Sudie Bailey, M.D.DATE OF BIRTH:  07-Aug-1940  DATE OF PROCEDURE: DATE OF DISCHARGE:                                PROGRESS NOTE   SUBJECTIVE:  She is breathing better.  OBJECTIVE:  GENERAL:  She is supine in bed. VITAL SIGNS:  Temperature is 97.6, pulse 82, respiratory rate 17, blood pressure 125/51. LUNGS:  Show decreased breath sounds throughout, but she is moving air fairly well and there are no rhonchi or rales or wheezing. HEART:  Has a fairly regular rhythm, rate of about 80, but heart sounds are distant. EXTREMITIES:  She still has edema of the distal legs, and the area of ulceration/laceration on the distal anterior lower left leg.  ASSESSMENT: 1. Chronic obstructive pulmonary disease exacerbation. 2. Congestive heart failure. 3. Morbid obesity. 4. Benign essential hypertension.  PLAN:  I am switching her to Hep-Lock and stopping her IV.  We will have physical therapy evaluate her. Her methylprednisolone IV is going to be discontinued and she will be put on prednisone instead. If she continues to improve she may be able to be discharged home in 1-2 days.     Mila Homer. Sudie Bailey, M.D.     SDK/MEDQ  D:  01/17/2013  T:  01/18/2013  Job:  [id number]

## 2013-01-19 LAB — GLUCOSE, CAPILLARY
Glucose-Capillary: 110 mg/dL — ABNORMAL HIGH (ref 70–99)
Glucose-Capillary: 128 mg/dL — ABNORMAL HIGH (ref 70–99)

## 2013-01-19 NOTE — Progress Notes (Signed)
Jill Robinson NO.:  [id number]  MEDICAL RECORD NO.:  [id number]  LOCATION:  A314                          FACILITY:  [facility]  PHYSICIAN:  Elberta Lachapelle G. Renard Matter, MD   DATE OF BIRTH:  1940/11/19  DATE OF PROCEDURE: DATE OF DISCHARGE:                                PROGRESS NOTE   SUBJECTIVE:  This patient has been treated for COPD.  She came in with shortness of breath and is feeling some better.  She does have other medical issues, morbid obesity, diabetes, and essential hypertension, which is fairly stable.  OBJECTIVE:  VITAL SIGNS:  Blood pressure 100/61, respiration 21, pulse 83, temp 98.3. HEENT:  Eyes PERRLA.  TMs negative.  Oropharynx benign. NECK:  Supple.  No JVD or thyroid abnormalities. HEART:  Regular rhythm.  No murmurs. LUNGS:  Decreased breath sounds. ABDOMEN:  No palpable organs or masses. EXTREMITIES:  Free of edema.  ASSESSMENT: 1. Chronic obstructive pulmonary disease with exacerbation. 2. Morbid obesity. 3. Diabetes mellitus. 4. Benign essential hypertension. 5. Congestive heart failure.  PLAN:  To continue current medicines, which is IV Levaquin, Solu-Medrol, neb treatments.  She is being transitioned to p.o. steroids.     Jill Robinson G. Renard Matter, MD     AGM/MEDQ  D:  01/18/2013  T:  01/19/2013  Job:  [id number]

## 2013-01-19 NOTE — Progress Notes (Signed)
NAMEARIANIS, BOWDITCH NO.:  [id number]  MEDICAL RECORD NO.:  [id number]  LOCATION:  A312                          FACILITY:  [facility]  PHYSICIAN:  Malicia Blasdel G. Renard Matter, MD   DATE OF BIRTH:  [birth year]  DATE OF PROCEDURE: DATE OF DISCHARGE:                                PROGRESS NOTE   SUBJECTIVE:  This patient is feeling fairly comfortable this morning. She is being treated for COPD.  Other medical issues; morbid obesity, diabetes, essential hypertension, all these conditions are fairly stable.  OBJECTIVE:  VITAL SIGNS:  Blood pressure 118/64, respirations 20, pulse 84, temp 98. HEENT:  Eyes, PERRLA.  TMs negative.  Oropharynx, benign. NECK:  Supple.  No JVD or thyroid abnormalities. HEART:  Regular rhythm.  No murmurs. LUNGS:  Clear to P and A but diminished breath sounds bilaterally. ABDOMEN:  No palpable organs or masses. EXTREMITIES:  Free of edema.  ASSESSMENT: 1. Chronic obstructive pulmonary disease with exacerbation. 2. Morbid obesity. 3. Diabetes mellitus. 4. Benign essential hypertension. 5. Congestive heart failure.  PLAN:  To continue current medications which is IV Levaquin and Solu- Medrol neb treatments.  The patient has been transitioned to p.o. steroids.     Bardia Wangerin G. Renard Matter, MD     AGM/MEDQ  D:  01/19/2013  T:  01/19/2013  Job:  [id number]

## 2013-01-20 ENCOUNTER — Ambulatory Visit: Payer: Self-pay | Admitting: Family

## 2013-01-20 ENCOUNTER — Other Ambulatory Visit (HOSPITAL_COMMUNITY): Payer: Self-pay

## 2013-01-20 LAB — GLUCOSE, CAPILLARY: Glucose-Capillary: 81 mg/dL (ref 70–99)

## 2013-01-20 MED ORDER — LEVOFLOXACIN 500 MG PO TABS: 500.0000 mg | ORAL_TABLET | Freq: Every day | ORAL | Status: DC

## 2013-01-20 MED ORDER — PREDNISONE 20 MG PO TABS: ORAL_TABLET | ORAL | Status: DC

## 2013-01-20 NOTE — Progress Notes (Signed)
UR chart review completed.  

## 2013-01-20 NOTE — Discharge Planning (Signed)
Pt stated she was ready to be [facility] and she had no pian.  Pt's IV was removed and she was given DC papers and educated on COPD s/sx that may need to be called to doctor or her to return to the hospital.  Pt told of FU appointments and given scripts.  Pt waiting on ride but will be wheeled to car by PCT and family when dressed and ready.

## 2013-01-20 NOTE — Discharge Summary (Signed)
Physician Discharge Summary  Patient ID: Jill Robinson MRN: [id number] DOB/AGE: 12/08/1940 72 y.o. Primary Care Physician:KNOWLTON,STEPHEN D, MD Admit date: 01/15/2013 Discharge date: 01/20/2013    Discharge Diagnoses:  1. COPD exacerbation. Improved. 2. Morbid obesity. 3. Type 2 diabetes mellitus. 4. Hypertension. 5. Diastolic congestive heart failure, clinically compensated.     Medication List         albuterol 108 (90 BASE) MCG/ACT inhaler  Commonly known as:  PROVENTIL HFA;VENTOLIN HFA  Inhale 2 puffs into the lungs every 6 (six) hours as needed for wheezing.     barrier cream Crea  Commonly known as:  non-specified  Apply 1 application topically 2 (two) times daily as needed (psoriasis).     clobetasol cream 0.05 %  Commonly known as:  TEMOVATE  Apply topically 2 (two) times daily.     clopidogrel 75 MG tablet  Commonly known as:  PLAVIX  Take 75 mg by mouth daily.     diphenhydrAMINE 25 MG tablet  Commonly known as:  BENADRYL  Take 25 mg by mouth every 6 (six) hours as needed for itching.     ferrous sulfate 325 (65 FE) MG tablet  Take 325 mg by mouth daily.     FISH OIL PO  Take 1 capsule by mouth daily.     Fluticasone-Salmeterol 250-50 MCG/DOSE Aepb  Commonly known as:  ADVAIR  Inhale 1 puff into the lungs every 12 (twelve) hours.     folic acid 1 MG tablet  Commonly known as:  FOLVITE  Take 1 mg by mouth daily.     furosemide 40 MG tablet  Commonly known as:  LASIX  Take 40 mg by mouth daily.     GARLIC PO  Take 1 tablet by mouth daily.     guaiFENesin 600 MG 12 hr tablet  Commonly known as:  MUCINEX  Take 1 tablet (600 mg total) by mouth 2 (two) times daily.     ipratropium 0.02 % nebulizer solution  Commonly known as:  ATROVENT  Take 500 mcg by nebulization 4 (four) times daily.     levofloxacin 500 MG tablet  Commonly known as:  LEVAQUIN  Take 1 tablet (500 mg total) by mouth daily at 6 PM.     meclizine 25 MG tablet   Commonly known as:  ANTIVERT  Take 25 mg by mouth 4 (four) times daily as needed for dizziness.     methotrexate 2.5 MG tablet  Commonly known as:  RHEUMATREX  Take 20 mg by mouth once a week. On Wednesday. Caution:Chemotherapy. Protect from light.     potassium chloride SA 20 MEQ tablet  Commonly known as:  K-DUR,KLOR-CON  Take 20 mEq by mouth daily.     predniSONE 20 MG tablet  Commonly known as:  DELTASONE  Take 2 tablets every morning for 3 days, then 1 tablet every morning for 3 days, then half a tablet every morning for 3 days, then STOP.        Discharged Condition: Stable and improved.    Consults: None.  Significant Diagnostic Studies: Dg Chest 2 View  01/15/2013   CLINICAL DATA:  Shortness of breath and weakness  EXAM: CHEST  2 VIEW  COMPARISON:  Prior radiograph from 01/01/2013  FINDINGS: Examination is somewhat limited due to patient body habitus. Cardiomegaly is grossly stable.  Lungs are normally inflated. Previously seen pulmonary vascular congestion is resolved. No pulmonary edema or pleural effusion. No focal infiltrate. No pneumothorax.  Osseous structures  are unchanged.  IMPRESSION: Stable cardiomegaly without pulmonary edema or active cardiopulmonary process.   Electronically Signed   By: Rise Mu M.D.   On: 01/15/2013 20:21   Dg Chest Port 1 View  01/01/2013   CLINICAL DATA:  COPD.  EXAM: PORTABLE CHEST - 1 VIEW  COMPARISON:  CT chest 04/25/2010 PA and lateral chest 03/07/2010 and single view of the chest 12/30/2012.  FINDINGS: There is cardiomegaly and vascular congestion. The lungs are emphysematous. No consolidative process, pneumothorax or effusion.  IMPRESSION: Cardiomegaly and pulmonary vascular congestion.   Electronically Signed   By: Drusilla Kanner M.D.   On: 01/01/2013 07:50   Dg Chest Port 1 View  12/30/2012   *RADIOLOGY REPORT*  Clinical Data: Oxygen desaturation.  PORTABLE CHEST - 1 VIEW  Comparison: CT of the chest February 23, 2011   Findings: Cardiac silhouette appears moderately enlarged.  Diffuse interstitial prominence with central pulmonary vasculature congestion.  No definite pleural effusions or focal consolidations though, large body habitus limits evaluation.  No pneumothorax though lung apices obscured by facial structures.  The patient appears edentulous.  IMPRESSION: Cardiomegaly with central pulmonary vascular congestion and interstitial prominence favoring moderate pulmonary edema. Consider follow-up PA and lateral views of chest, after treatment  to verify improvement.   Original Report Authenticated By: Awilda Metro       CBC: No results found for this basename: WBC, NEUTROABS, HGB, HCT, MCV, PLT,  in the last 72 hours  Recent Results (from the past 240 hour(s))  URINE CULTURE     Status: None   Collection Time    01/16/13  7:49 AM      Result Value Range Status   Specimen Description URINE, CLEAN CATCH   Final   Special Requests NONE   Final   Culture  Setup Time     Final   Value: 01/16/2013 14:09     Performed at [facility] Count     Final   Value: >=100,000 COLONIES/ML     Performed at [facility]   Culture     Final   Value: Multiple bacterial morphotypes present, none predominant. Suggest appropriate recollection if clinically indicated.     Performed at [facility]   Report Status 01/17/2013 FINAL   Final     Hospital Course: This 72 year old morbidly obese lady who has COPD presented once again with dyspnea. Please see initial history as outlined below: SUBJECTIVE: This 72 year old woman developed shortness of breath  starting about 3 days before admission. Shortness of breath became  severe so she came to [facility] Emergency Room for  evaluation.  She does have a history of COPD, diabetes, obesity, hypertension,  psoriasis, and other issues.  She still smokes half a pack of cigarettes a day.  She was hospitalized here about 3  weeks ago. The patient was admitted and treated appropriately with intravenous steroids and antibiotics. She made good improvement with also bronchodilators. She is now back to her baseline and is keen to go home. She has been counseled about tobacco cessation.  Discharge Exam: Blood pressure 125/43, pulse 83, temperature 97.8 F (36.6 C), temperature source Oral, resp. rate 20, height 5\' 3"  (1.6 m), weight 115.2 kg (253 lb 15.5 oz), SpO2 93.00%. She is morbidly obese. There is no increased work of breathing. There is no peripheral central cyanosis. Lung fields are entirely clear with very little in the way of wheezing, and perhaps just a few areas.  There are no crackles or bronchial breathing. She is alert and orientated.  Disposition: Home.      Discharge Orders   Future Appointments Provider Department Dept Phone   02/17/2013 11:30 AM [facility]  [facility] [address] [phone]   02/17/2013 12:40 PM Carma Lair Nickel, NP [facility] [address] [phone]   Future Orders Complete By Expires   Diet - low sodium heart healthy  As directed    Increase activity slowly  As directed       Follow-up Information   Follow up with Milana Obey, MD. Schedule an appointment as soon as possible for a visit in 1 week.   Specialty:  Family Medicine   Contact information:   [address] [place] [address] [phone]       Signed: Wilson Singer Pager [phone]  01/20/2013, 8:37 AM

## 2013-02-14 ENCOUNTER — Encounter: Payer: Self-pay | Admitting: Family

## 2013-02-17 ENCOUNTER — Other Ambulatory Visit (HOSPITAL_COMMUNITY): Payer: Self-pay

## 2013-02-17 ENCOUNTER — Ambulatory Visit: Payer: Self-pay | Admitting: Family

## 2013-02-24 ENCOUNTER — Inpatient Hospital Stay (HOSPITAL_COMMUNITY)
Admission: EM | Admit: 2013-02-24 | Discharge: 2013-03-13 | DRG: 811 | Disposition: E | Payer: Medicare Other | Attending: Family Medicine | Admitting: Family Medicine

## 2013-02-24 ENCOUNTER — Encounter (HOSPITAL_COMMUNITY): Payer: Self-pay | Admitting: Emergency Medicine

## 2013-02-24 ENCOUNTER — Emergency Department (HOSPITAL_COMMUNITY): Payer: Medicare Other

## 2013-02-24 DIAGNOSIS — D638 Anemia in other chronic diseases classified elsewhere: Principal | ICD-10-CM | POA: Diagnosis present

## 2013-02-24 DIAGNOSIS — N390 Urinary tract infection, site not specified: Secondary | ICD-10-CM | POA: Diagnosis present

## 2013-02-24 DIAGNOSIS — L98499 Non-pressure chronic ulcer of skin of other sites with unspecified severity: Secondary | ICD-10-CM | POA: Diagnosis present

## 2013-02-24 DIAGNOSIS — T451X5A Adverse effect of antineoplastic and immunosuppressive drugs, initial encounter: Secondary | ICD-10-CM | POA: Diagnosis present

## 2013-02-24 DIAGNOSIS — M129 Arthropathy, unspecified: Secondary | ICD-10-CM | POA: Diagnosis present

## 2013-02-24 DIAGNOSIS — E860 Dehydration: Secondary | ICD-10-CM | POA: Diagnosis present

## 2013-02-24 DIAGNOSIS — F17200 Nicotine dependence, unspecified, uncomplicated: Secondary | ICD-10-CM | POA: Diagnosis present

## 2013-02-24 DIAGNOSIS — R630 Anorexia: Secondary | ICD-10-CM | POA: Diagnosis present

## 2013-02-24 DIAGNOSIS — K228 Other specified diseases of esophagus: Secondary | ICD-10-CM | POA: Diagnosis present

## 2013-02-24 DIAGNOSIS — Z6841 Body Mass Index (BMI) 40.0 and over, adult: Secondary | ICD-10-CM

## 2013-02-24 DIAGNOSIS — Z9981 Dependence on supplemental oxygen: Secondary | ICD-10-CM

## 2013-02-24 DIAGNOSIS — K2100 Gastro-esophageal reflux disease with esophagitis, without bleeding: Secondary | ICD-10-CM | POA: Diagnosis present

## 2013-02-24 DIAGNOSIS — Z86718 Personal history of other venous thrombosis and embolism: Secondary | ICD-10-CM

## 2013-02-24 DIAGNOSIS — A419 Sepsis, unspecified organism: Secondary | ICD-10-CM | POA: Diagnosis not present

## 2013-02-24 DIAGNOSIS — J449 Chronic obstructive pulmonary disease, unspecified: Secondary | ICD-10-CM | POA: Diagnosis present

## 2013-02-24 DIAGNOSIS — J9600 Acute respiratory failure, unspecified whether with hypoxia or hypercapnia: Secondary | ICD-10-CM | POA: Diagnosis not present

## 2013-02-24 DIAGNOSIS — D649 Anemia, unspecified: Secondary | ICD-10-CM | POA: Diagnosis present

## 2013-02-24 DIAGNOSIS — N170 Acute kidney failure with tubular necrosis: Secondary | ICD-10-CM | POA: Diagnosis not present

## 2013-02-24 DIAGNOSIS — B372 Candidiasis of skin and nail: Secondary | ICD-10-CM | POA: Diagnosis present

## 2013-02-24 DIAGNOSIS — R410 Disorientation, unspecified: Secondary | ICD-10-CM

## 2013-02-24 DIAGNOSIS — K2289 Other specified disease of esophagus: Secondary | ICD-10-CM | POA: Diagnosis present

## 2013-02-24 DIAGNOSIS — N183 Chronic kidney disease, stage 3 unspecified: Secondary | ICD-10-CM | POA: Diagnosis present

## 2013-02-24 DIAGNOSIS — E875 Hyperkalemia: Secondary | ICD-10-CM | POA: Diagnosis present

## 2013-02-24 DIAGNOSIS — J4489 Other specified chronic obstructive pulmonary disease: Secondary | ICD-10-CM | POA: Diagnosis present

## 2013-02-24 DIAGNOSIS — J189 Pneumonia, unspecified organism: Secondary | ICD-10-CM | POA: Diagnosis not present

## 2013-02-24 DIAGNOSIS — I872 Venous insufficiency (chronic) (peripheral): Secondary | ICD-10-CM | POA: Diagnosis present

## 2013-02-24 DIAGNOSIS — E119 Type 2 diabetes mellitus without complications: Secondary | ICD-10-CM | POA: Diagnosis present

## 2013-02-24 DIAGNOSIS — I779 Disorder of arteries and arterioles, unspecified: Secondary | ICD-10-CM | POA: Diagnosis present

## 2013-02-24 DIAGNOSIS — L4050 Arthropathic psoriasis, unspecified: Secondary | ICD-10-CM | POA: Diagnosis present

## 2013-02-24 DIAGNOSIS — L409 Psoriasis, unspecified: Secondary | ICD-10-CM

## 2013-02-24 DIAGNOSIS — G609 Hereditary and idiopathic neuropathy, unspecified: Secondary | ICD-10-CM | POA: Diagnosis present

## 2013-02-24 DIAGNOSIS — Z7902 Long term (current) use of antithrombotics/antiplatelets: Secondary | ICD-10-CM

## 2013-02-24 DIAGNOSIS — I129 Hypertensive chronic kidney disease with stage 1 through stage 4 chronic kidney disease, or unspecified chronic kidney disease: Secondary | ICD-10-CM | POA: Diagnosis present

## 2013-02-24 DIAGNOSIS — Z8673 Personal history of transient ischemic attack (TIA), and cerebral infarction without residual deficits: Secondary | ICD-10-CM

## 2013-02-24 DIAGNOSIS — I509 Heart failure, unspecified: Secondary | ICD-10-CM | POA: Diagnosis present

## 2013-02-24 DIAGNOSIS — Z66 Do not resuscitate: Secondary | ICD-10-CM | POA: Diagnosis present

## 2013-02-24 HISTORY — DX: Venous insufficiency (chronic) (peripheral): I87.2

## 2013-02-24 HISTORY — DX: Heart failure, unspecified: I50.9

## 2013-02-24 LAB — BASIC METABOLIC PANEL
BUN: 31 mg/dL — ABNORMAL HIGH (ref 6–23)
Calcium: 8.9 mg/dL (ref 8.4–10.5)
GFR calc Af Amer: 36 mL/min — ABNORMAL LOW (ref >90)
GFR calc non Af Amer: 31 mL/min — ABNORMAL LOW (ref >90)
Glucose, Bld: 102 mg/dL — ABNORMAL HIGH (ref 70–99)
Sodium: 139 mEq/L (ref 135–145)

## 2013-02-24 LAB — CBC WITH DIFFERENTIAL/PLATELET
Basophils Relative: 0 % (ref 0–1)
Eosinophils Absolute: 0.1 10*3/uL (ref 0.0–0.7)
Eosinophils Relative: 4 % (ref 0–5)
Hemoglobin: 4.8 g/dL — CL (ref 12.0–15.0)
Lymphs Abs: 1.2 10*3/uL (ref 0.7–4.0)
MCH: 30 pg (ref 26.0–34.0)
MCHC: 31 g/dL (ref 30.0–36.0)
MCV: 96.9 fL (ref 78.0–100.0)
Monocytes Relative: 4 % (ref 3–12)
Neutro Abs: 1.2 10*3/uL — ABNORMAL LOW (ref 1.7–7.7)
Neutrophils Relative %: 45 % (ref 43–77)
Platelets: 211 10*3/uL (ref 150–400)
RBC: 1.6 MIL/uL — ABNORMAL LOW (ref 3.87–5.11)
RDW: 17.6 % — ABNORMAL HIGH (ref 11.5–15.5)
WBC: 2.6 10*3/uL — ABNORMAL LOW (ref 4.0–10.5)

## 2013-02-24 LAB — ABO/RH: ABO/RH(D): B POS

## 2013-02-24 MED ORDER — BARRIER CREAM NON-SPECIFIED
1.0000 "application " | TOPICAL_CREAM | Freq: Two times a day (BID) | TOPICAL | Status: DC | PRN
  Filled 2013-02-24: qty 1

## 2013-02-24 MED ORDER — FOLIC ACID 1 MG PO TABS
1.0000 mg | ORAL_TABLET | Freq: Every day | ORAL | Status: DC
  Administered 2013-02-24 – 2013-03-04 (×9): 1 mg via ORAL
  Filled 2013-02-24 (×10): qty 1

## 2013-02-24 MED ORDER — FUROSEMIDE 40 MG PO TABS
40.0000 mg | ORAL_TABLET | Freq: Every day | ORAL | Status: DC
  Administered 2013-02-24 – 2013-03-02 (×7): 40 mg via ORAL
  Filled 2013-02-24 (×8): qty 1

## 2013-02-24 MED ORDER — FLUCONAZOLE IN SODIUM CHLORIDE 200-0.9 MG/100ML-% IV SOLN
INTRAVENOUS | Status: AC
  Filled 2013-02-24: qty 100

## 2013-02-24 MED ORDER — FENTANYL CITRATE 0.05 MG/ML IJ SOLN
50.0000 ug | Freq: Once | INTRAMUSCULAR | Status: AC
  Administered 2013-02-24: 50 ug via INTRAVENOUS
  Filled 2013-02-24: qty 2

## 2013-02-24 MED ORDER — HYDROCODONE-ACETAMINOPHEN 10-325 MG PO TABS
1.0000 | ORAL_TABLET | ORAL | Status: DC | PRN
  Administered 2013-02-24 – 2013-03-02 (×12): 1 via ORAL
  Filled 2013-02-24 (×12): qty 1

## 2013-02-24 MED ORDER — SODIUM CHLORIDE 0.9 % IV SOLN
INTRAVENOUS | Status: DC
  Administered 2013-02-24 – 2013-02-26 (×4): via INTRAVENOUS

## 2013-02-24 MED ORDER — ALBUTEROL SULFATE HFA 108 (90 BASE) MCG/ACT IN AERS
2.0000 | INHALATION_SPRAY | Freq: Four times a day (QID) | RESPIRATORY_TRACT | Status: DC | PRN
  Administered 2013-02-28: 2 via RESPIRATORY_TRACT
  Filled 2013-02-24 (×2): qty 6.7

## 2013-02-24 MED ORDER — FLUCONAZOLE 100MG IVPB
100.0000 mg | INTRAVENOUS | Status: DC
  Filled 2013-02-24: qty 50

## 2013-02-24 MED ORDER — DIPHENHYDRAMINE HCL 25 MG PO CAPS
25.0000 mg | ORAL_CAPSULE | Freq: Four times a day (QID) | ORAL | Status: DC | PRN
  Administered 2013-03-02 – 2013-03-04 (×3): 25 mg via ORAL
  Filled 2013-02-24 (×5): qty 1

## 2013-02-24 MED ORDER — CLOPIDOGREL BISULFATE 75 MG PO TABS
75.0000 mg | ORAL_TABLET | Freq: Every day | ORAL | Status: DC
  Administered 2013-02-25 – 2013-03-04 (×8): 75 mg via ORAL
  Filled 2013-02-24 (×9): qty 1

## 2013-02-24 MED ORDER — FERROUS SULFATE 325 (65 FE) MG PO TABS
325.0000 mg | ORAL_TABLET | Freq: Every day | ORAL | Status: DC
  Administered 2013-02-24 – 2013-02-25 (×2): 325 mg via ORAL
  Filled 2013-02-24 (×5): qty 1

## 2013-02-24 MED ORDER — IPRATROPIUM BROMIDE 0.02 % IN SOLN: 500.0000 ug | Freq: Four times a day (QID) | RESPIRATORY_TRACT | Status: DC

## 2013-02-24 MED ORDER — CLOPIDOGREL BISULFATE 75 MG PO TABS: 75.0000 mg | ORAL_TABLET | Freq: Every day | ORAL | Status: DC

## 2013-02-24 MED ORDER — POTASSIUM CHLORIDE CRYS ER 20 MEQ PO TBCR
20.0000 meq | EXTENDED_RELEASE_TABLET | Freq: Every day | ORAL | Status: DC
  Administered 2013-02-24 – 2013-03-03 (×8): 20 meq via ORAL
  Filled 2013-02-24 (×9): qty 1

## 2013-02-24 MED ORDER — IPRATROPIUM BROMIDE 0.02 % IN SOLN
500.0000 ug | Freq: Four times a day (QID) | RESPIRATORY_TRACT | Status: DC
  Administered 2013-02-24 – 2013-02-25 (×3): 500 ug via RESPIRATORY_TRACT
  Filled 2013-02-24 (×3): qty 2.5

## 2013-02-24 MED ORDER — ALBUTEROL SULFATE (5 MG/ML) 0.5% IN NEBU
5.0000 mg | INHALATION_SOLUTION | Freq: Once | RESPIRATORY_TRACT | Status: AC
  Administered 2013-02-24: 5 mg via RESPIRATORY_TRACT
  Filled 2013-02-24: qty 1

## 2013-02-24 MED ORDER — MOMETASONE FURO-FORMOTEROL FUM 100-5 MCG/ACT IN AERO
INHALATION_SPRAY | RESPIRATORY_TRACT | Status: AC
  Filled 2013-02-24: qty 8.8

## 2013-02-24 MED ORDER — ALBUTEROL SULFATE (5 MG/ML) 0.5% IN NEBU
2.5000 mg | INHALATION_SOLUTION | RESPIRATORY_TRACT | Status: DC | PRN
  Filled 2013-02-24: qty 0.5

## 2013-02-24 MED ORDER — ACETAMINOPHEN 500 MG PO TABS
500.0000 mg | ORAL_TABLET | ORAL | Status: DC | PRN
  Administered 2013-02-26 – 2013-03-04 (×2): 500 mg via ORAL
  Filled 2013-02-24 (×3): qty 1

## 2013-02-24 MED ORDER — MOMETASONE FURO-FORMOTEROL FUM 100-5 MCG/ACT IN AERO
2.0000 | INHALATION_SPRAY | Freq: Two times a day (BID) | RESPIRATORY_TRACT | Status: DC
  Administered 2013-02-25: 2 via RESPIRATORY_TRACT
  Filled 2013-02-24: qty 8.8

## 2013-02-24 NOTE — ED Notes (Addendum)
Pt c/o SOB with exertion.  Placed in upright position. Lab at bedside to draw type and screen.

## 2013-02-24 NOTE — H&P (Signed)
This patient came to the emergency room today due to increased weakness and multiple very sore areas in her groin areas, her perineum, and elsewhere.  She has a long and complicated medical history. Medical problems include congestive heart failure, venous insufficiency, edema, tobacco use disorder, psoriasis, COPD, morbid obesity, diabetes, anemia of chronic disease, speech and language deficist secondary to a prior stroke, carotid artery disease, idiopathic peripheral neuropathy, and other issues.  Apparently she is at no recent black tarry stool but she has had abdominal pain and diarrhea.  She has been on methotrexate for some time prescribed by a dermatologist in [facility] for her psoriasis.  This has helped her psoriasis remarkably.  She was recently seen in the office, about 3 weeks ago.  At that time the medications she was on included ferrous sulfate 325 mg daily, oxygen at 3 L, acetaminophen through 25 mg when necessary, diphenhydramine 25 mg when necessary, multivitamins daily, methotrexate 2.5 mg, 6 tablets a week, furosemide 40 mg daily for fluid, albuterol and ipratropium neb treatments every 4 hours for breathing, KCl 20 mg daily, Advair Diskus 1 puff twice a day, folic acid 1 mg daily, Plavix 75 mg daily, proAir HFA 2 puffs every 4 hours for breathing, ketoconazole 2% cream and desonide 0.05% cream as needed.  I initially saw her in the emergency room.  Her daughter was with her.  Her physical exam showed a morbidly obese elderly woman who appeared somewhat pale.  She was dyspneic.  Her vital signs showed a temperature of 99.4, pulse 107, respiratory rate 26, and blood pressure of 133/98.  Her speech was normal.  Her sensorium was intact.  She had truncal obesity.  She had severe maceration of the skin in the inguinal areas and under the breasts and other intertriginous stones.  The maceration and erythema extended to the perineal area.  She was absolutely miserable with  this.  In addition she had at least a grade 1/4-sized ulceration of the distal anterior left lower leg.  She had erythema, at least 1+ edema, and some scaling of the distal legs.  Her admission white cell count was 2600, her hemoglobin 4.8, and a platelet count 211,000.  A little over one month ago her hemoglobin was 10.0 and her white cell count 9600.  Her platelet count was 137,000 at that time.  The differential today was normal, with 45% neutrophils and 47% lymphocytes.  Her BUN was 31 and creatinine 1.62, significantly changed from early November.  Her chest x-ray showed what appeared to be vascular congestion without full-blown CHF.  Admission diagnoses include: #1 severe anemia. #2 yeast infection of the intertriginous areas. #3 type 2 diabetes, well controlled. #4 morbid obesity. #5 dehydration. #6 superficial skin ulcer of the left lower leg. #7 venous insufficiency. #8 oxygen-dependent COPD. #9 personal history of tobacco use.  At present treatment will include transfusions of packed red cells and treatment of the yeast infection.  Her diabetes will be followed.  She will be on pain medication for the severe pain caused by skin breakdown.  I am not sure whether her anemia is secondary to GI bleeding, methotrexate toxic effects of the bone marrow, or hemolysis.  A peripheral smear is pending.  I am continuing her on her home medication except for methotrexate.    Her lungs show decreased breath sounds throughout but she is moving air well.  Her heart had a regular rhythm with a rate of 100.

## 2013-02-24 NOTE — ED Notes (Signed)
Introduced self, offered blanket.

## 2013-02-24 NOTE — ED Notes (Addendum)
Lab called critical Hgb 4.8 and Hct 15.5.  Notified Dr Rubin Payor

## 2013-02-24 NOTE — ED Notes (Signed)
Skin breakdown to buttocks, back of upper legs and abdominal skin folds, and under breasts Called Dr. Michelle Nasuti office and was told to come to the ED. Pt is on o2 at 2L at all times.

## 2013-02-24 NOTE — ED Provider Notes (Signed)
CSN: [id number]     Arrival date & time 03/21/2013  1224 History   First MD Initiated Contact with Patient 03/21/13 1614     Chief Complaint  Patient presents with  . Skin Ulcer   (Consider location/radiation/quality/duration/timing/severity/associated sxs/prior Treatment) The history is provided by the patient.   patient is sent with worsening skin infections. She has chronic psoriasis on methotrexate. She states infection on her left leg has been getting worse along with infections on her groin and buttock. She denies fevers. She states the pain is severe. She denies blood in stool. She states she has been getting wound care from her son. She states she talked to Dr. Michelle Nasuti office who told her to come to the ED.  Past Medical History  Diagnosis Date  . Arthritis   . Joint pain   . Stroke   . Ulcer   . COPD (chronic obstructive pulmonary disease)   . Diabetes mellitus   . Carotid artery occlusion   . Hypertension   . Psoriasis   . DVT (deep venous thrombosis)    Past Surgical History  Procedure Laterality Date  . Carotid stent  2012   No family history on file. History  Substance Use Topics  . Smoking status: Current Every Day Smoker -- 0.50 packs/day    Types: Cigarettes  . Smokeless tobacco: Former Neurosurgeon    Quit date: 12/30/2012     Comment: pt states that she is trying to quit  . Alcohol Use: No   OB History   Grav Para Term Preterm Abortions TAB SAB Ect Mult Living                 Review of Systems  Constitutional: Positive for fatigue. Negative for activity change and appetite change.  Eyes: Negative for pain.  Respiratory: Positive for cough and shortness of breath. Negative for chest tightness.   Cardiovascular: Negative for chest pain and leg swelling.  Gastrointestinal: Negative for nausea, vomiting, abdominal pain and diarrhea.  Genitourinary: Negative for flank pain.  Musculoskeletal: Negative for back pain and neck stiffness.  Skin: Positive for color  change, rash and wound.  Neurological: Negative for weakness, numbness and headaches.  Psychiatric/Behavioral: Negative for behavioral problems.    Allergies  Aspirin  Home Medications   No current outpatient prescriptions on file. BP 133/98  Pulse 107  Temp(Src) 99.4 F (37.4 C) (Oral)  Resp 26  Ht 5\' 3"  (1.6 m)  Wt 247 lb 9.6 oz (112.311 kg)  BMI 43.87 kg/m2  SpO2 95% Physical Exam  Nursing note and vitals reviewed. Constitutional: She is oriented to person, place, and time. She appears well-developed and well-nourished.  HENT:  Head: Normocephalic and atraumatic.  Eyes: EOM are normal. Pupils are equal, round, and reactive to light.  Neck: Normal range of motion. Neck supple.  Cardiovascular: Regular rhythm and normal heart sounds.   No murmur heard. Mild tachycardia  Pulmonary/Chest: No respiratory distress. She has no wheezes. She has no rales.  Mild tachypnea and harsh breath sounds  Abdominal: Soft. Bowel sounds are normal. She exhibits no distension. There is no tenderness. There is no rebound and no guarding.  Musculoskeletal: Normal range of motion. She exhibits edema and tenderness.  Ulcer to left lower leg. Surrounding erythema covering most of lower leg. Diffuse edema. Diffuse rash in bilateral inguinal folds and down the perineum and buttocks. Redness and some drainage. No frank bleeding. No palpated abscesses.  Neurological: She is alert and oriented to person, place, and  time. No cranial nerve deficit.  Skin: Skin is warm and dry. There is pallor.  Psychiatric: She has a normal mood and affect. Her speech is normal.    ED Course  Procedures (including critical care time) Labs Review Labs Reviewed  CBC WITH DIFFERENTIAL - Abnormal; Notable for the following:    WBC 2.6 (*)    RBC 1.60 (*)    Hemoglobin 4.8 (*)    HCT 15.5 (*)    RDW 17.6 (*)    Neutro Abs 1.2 (*)    Lymphocytes Relative 47 (*)    All other components within normal limits  BASIC  METABOLIC PANEL - Abnormal; Notable for the following:    Glucose, Bld 102 (*)    BUN 31 (*)    Creatinine, Ser 1.62 (*)    GFR calc non Af Amer 31 (*)    GFR calc Af Amer 36 (*)    All other components within normal limits  GLUCOSE, CAPILLARY - Abnormal; Notable for the following:    Glucose-Capillary 107 (*)    All other components within normal limits  CBC  BASIC METABOLIC PANEL  SAVE SMEAR  PREPARE RBC (CROSSMATCH)  TYPE AND SCREEN  ABO/RH   Imaging Review Dg Chest Port 1 View  03/06/2013   CLINICAL DATA:  Shortness of Breath  EXAM: PORTABLE CHEST - 1 VIEW  COMPARISON:  01/15/2013  FINDINGS: Cardiomegaly. Central mild vascular congestion and mild interstitial prominence without convincing pulmonary edema. Stable left basilar atelectasis. No segmental infiltrate.  IMPRESSION: Cardiomegaly. No convincing pulmonary edema. Left basilar atelectasis. No segmental infiltrate.   Electronically Signed   By: Natasha Mead M.D.   On: 02/18/2013 17:03    EKG Interpretation    Date/Time:  Monday February 24 2013 16:20:55 EST Ventricular Rate:  99 PR Interval:  170 QRS Duration: 88 QT Interval:  326 QTC Calculation: 418 R Axis:   67 Text Interpretation:  Normal sinus rhythm with sinus arrhythmia Nonspecific ST abnormality Abnormal ECG When compared with ECG of 15-Jan-2013 19:12, Non-specific change in ST segment in Inferior leads Nonspecific T wave abnormality, improved in Lateral leads Confirmed by Dodi Leu  MD, Crystal Scarberry ([id number]) on 03/04/2013 10:10:27 PM            MDM   1. Anemia   2. Psoriasis    Patient brought in for pain with her rash and wound. May be a yeast infection on top of her psoriasis. Also found to be anemic. Will be admitted to Dr. Burna Forts R. Rubin Payor, MD 03/09/2013 2211

## 2013-02-24 NOTE — ED Notes (Signed)
Pt instructed she needed to wait to be seen before fluids can be offered.

## 2013-02-25 ENCOUNTER — Encounter (HOSPITAL_COMMUNITY): Payer: Self-pay | Admitting: Gastroenterology

## 2013-02-25 DIAGNOSIS — D649 Anemia, unspecified: Secondary | ICD-10-CM

## 2013-02-25 DIAGNOSIS — R197 Diarrhea, unspecified: Secondary | ICD-10-CM

## 2013-02-25 LAB — BASIC METABOLIC PANEL
Calcium: 8.6 mg/dL (ref 8.4–10.5)
Creatinine, Ser: 1.56 mg/dL — ABNORMAL HIGH (ref 0.50–1.10)
GFR calc Af Amer: 38 mL/min — ABNORMAL LOW (ref >90)
Sodium: 140 mEq/L (ref 135–145)

## 2013-02-25 LAB — CBC
MCH: 29.9 pg (ref 26.0–34.0)
MCV: 93.4 fL (ref 78.0–100.0)
Platelets: 179 10*3/uL (ref 150–400)
RDW: 17.4 % — ABNORMAL HIGH (ref 11.5–15.5)
WBC: 2.5 10*3/uL — ABNORMAL LOW (ref 4.0–10.5)

## 2013-02-25 LAB — HEPATIC FUNCTION PANEL
ALT: 5 U/L (ref 0–35)
AST: 11 U/L (ref 0–37)
Albumin: 2.4 g/dL — ABNORMAL LOW (ref 3.5–5.2)
Bilirubin, Direct: 0.3 mg/dL (ref 0.0–0.3)
Indirect Bilirubin: 0.5 mg/dL (ref 0.3–0.9)

## 2013-02-25 LAB — GLUCOSE, CAPILLARY
Glucose-Capillary: 86 mg/dL (ref 70–99)
Glucose-Capillary: 105 mg/dL — ABNORMAL HIGH (ref 70–99)

## 2013-02-25 LAB — LACTATE DEHYDROGENASE: LDH: 190 U/L (ref 94–250)

## 2013-02-25 LAB — PATHOLOGIST SMEAR REVIEW

## 2013-02-25 MED ORDER — FLUCONAZOLE 100 MG PO TABS
100.0000 mg | ORAL_TABLET | Freq: Every day | ORAL | Status: DC
  Administered 2013-02-25 – 2013-03-04 (×8): 100 mg via ORAL
  Filled 2013-02-25 (×9): qty 1

## 2013-02-25 MED ORDER — ALBUTEROL SULFATE (5 MG/ML) 0.5% IN NEBU
2.5000 mg | INHALATION_SOLUTION | Freq: Three times a day (TID) | RESPIRATORY_TRACT | Status: DC
  Administered 2013-02-25 – 2013-03-05 (×24): 2.5 mg via RESPIRATORY_TRACT
  Filled 2013-02-25 (×24): qty 0.5

## 2013-02-25 MED ORDER — PANTOPRAZOLE SODIUM 40 MG PO TBEC
40.0000 mg | DELAYED_RELEASE_TABLET | Freq: Every day | ORAL | Status: DC
  Administered 2013-02-25: 40 mg via ORAL
  Filled 2013-02-25: qty 1

## 2013-02-25 MED ORDER — IPRATROPIUM BROMIDE 0.02 % IN SOLN
500.0000 ug | Freq: Three times a day (TID) | RESPIRATORY_TRACT | Status: DC
  Administered 2013-02-25 – 2013-03-05 (×24): 500 ug via RESPIRATORY_TRACT
  Filled 2013-02-25 (×24): qty 2.5

## 2013-02-25 MED ORDER — PANTOPRAZOLE SODIUM 40 MG PO TBEC
40.0000 mg | DELAYED_RELEASE_TABLET | Freq: Two times a day (BID) | ORAL | Status: DC
  Administered 2013-02-26 – 2013-03-04 (×12): 40 mg via ORAL
  Filled 2013-02-25 (×14): qty 1

## 2013-02-25 MED ORDER — FUROSEMIDE 10 MG/ML IJ SOLN
40.0000 mg | Freq: Once | INTRAMUSCULAR | Status: AC
  Administered 2013-02-25: 40 mg via INTRAVENOUS
  Filled 2013-02-25: qty 4

## 2013-02-25 NOTE — Consult Note (Addendum)
REVIEWED. PT HAS MULTIPLE REASONS FOR ANEMIA: CRI, LARGE OPEN WOUND WITH BRB ON DRESSING, ? MTX. NO OVERT GI BLEED. CONTINUE TO MONITOR SYMPTOMS. CONSIDER EGD. NPO AFTER MN. BID PPI. DOUBT PT WILL BE ABLE TO PREP FOR TCS. HOLD IRON.   REVIEWED.

## 2013-02-25 NOTE — Care Management Note (Signed)
    Page 1 of 1   02/25/2013     1:04:28 PM   CARE MANAGEMENT NOTE 02/25/2013  Patient:  Jill Robinson, Jill Robinson   Account Number:  [id number]  Date Initiated:  02/25/2013  Documentation initiated by:  Rosemary Holms  Subjective/Objective Assessment:   Per pt she lives at home with her son and family. Pt is agreeable to [facility] at DC and selected [facility].     Action/Plan:   Anticipated DC Date:     Anticipated DC Plan:  HOME W HOME HEALTH SERVICES      DC Planning Services  CM consult      Choice offered to / List presented to:             Status of service:  Completed, signed off Medicare Important Message given?   (If response is "NO", the following Medicare IM given date fields will be blank) Date Medicare IM given:   Date Additional Medicare IM given:    Discharge Disposition:    Per UR Regulation:    If discussed at Long Length of Stay Meetings, dates discussed:    Comments:  02/25/13 Rosemary Holms RN BSN CM

## 2013-02-25 NOTE — Progress Notes (Signed)
Utilization Review Complete  

## 2013-02-25 NOTE — Consult Note (Signed)
WOC wound consult note Reason for Consult: Denuded skin to sacrum from incontinence, present on admission.  Moisture Associated  Skin Damage.  Wound type: MASD Measurement: generalized to buttocks and perineum Wound bed: Weepy, red and moist.  Drainage (amount, consistency, odor) minimal, serous Periwound: erythematous Dressing procedure/placement/frequency: Cleanse area thoroughly and apply SensiCare #3 barrier cream.  Remove soiled areas with incontinence, but do not scrub off all of the cream.  Re-apply daily and with incontinence.  Will not follow at this time.  Please re-consult if needed.  Maple Hudson RN BSN CWON Pager [phone]

## 2013-02-25 NOTE — Clinical Documentation Improvement (Signed)
THIS DOCUMENT IS NOT A PERMANENT PART OF THE MEDICAL RECORD  Please update your documentation with the medical record to reflect your response to this query. If you need help knowing how to do this please call [phone].  02/25/13  Dear Dr. Sudie Bailey Marton Redwood,  A review of the patient medical record has revealed the following indicators.    Based on your clinical judgment, please clarify and document in a progress note and/or discharge summary the clinical condition associated with the following supporting information:  Please clarify respiratory status. Thank you!  Possible Clinical Conditions?  Acute Respiratory Failure Acute on Chronic Respiratory Failure Chronic Respiratory Failure Other Condition________________ Cannot Clinically Determine    Supporting Information:  Risk Factors: Oxygen dependent COPD oxygen at 3 L  Signs&Symptoms: Weakness Respiratory rate: 26 Per H&P She was dyspneic. Her lungs show decreased breath sounds throughout but she is moving air well. Her heart had a regular rhythm with a rate of 100.  Diagnostics: Her chest x-ray showed what appeared to be vascular congestion without full-blown CHF.  Treatment: Proventil inhaler q6h prn Proventil nebs 5mg  x  Dose and 2.5 q4h prn Atrovent neb qid Dulera inhaler bid O2 via [place] Keep O2 sats >92%  You may use possible, probable, or suspect with inpatient documentation. possible, probable, suspected diagnoses MUST be documented at the time of discharge  Reviewed: She had acute respiratory failure.  Thank You,  Harless Litten  Clinical Documentation Specialist: [phone] Health Information Management [place]

## 2013-02-25 NOTE — Consult Note (Signed)
Referring Provider: Milana Obey, MD Primary Care Physician:  Milana Obey, MD Primary Gastroenterologist:  Jonette Eva, MD  Reason for Consultation:  anemia  HPI: Jill Robinson is a 72 y.o. female presented with weakness.  Recent admission last month for COPD exacerbation. She is on chronic Plavix. Denies ASA. On admission her Hgb 4.8, MCV 96.9. On 01/16/13, her Hgb was 9.5. Typically runs in the 9-10 range. No current anemia panel. Creatinine elevated at 1.62, at discharge 01/2013 was 1.3. She is on chronic iron and folate therapy. On methotrexate for psoriasis. She has ulceration of the distal anterior left lower leg and severe yeast infection of the intertriginous areas. Significant denuded areas and ulceration on the buttocks, perineal area.   No prior TCS/EGD. No melena, brbpr. Poor appetite for a "pretty good while". No ASA/NSAIDs. Denies constipation, diarrhea, abdominal pain, heartburn, vomiting, dysphagia.   Had a large liquid dark green stool during exam. Heme negative.   Prior to Admission medications   Medication Sig Start Date End Date Taking? Authorizing Provider  albuterol (PROVENTIL HFA;VENTOLIN HFA) 108 (90 BASE) MCG/ACT inhaler Inhale 2 puffs into the lungs every 6 (six) hours as needed for wheezing.   Yes Historical Provider, MD  albuterol (PROVENTIL) (2.5 MG/3ML) 0.083% nebulizer solution Take 2.5 mg by nebulization every 6 (six) hours as needed for wheezing or shortness of breath.   Yes Historical Provider, MD  barrier cream (NON-SPECIFIED) CREA Apply 1 application topically 2 (two) times daily as needed (psoriasis).   Yes Historical Provider, MD  clopidogrel (PLAVIX) 75 MG tablet Take 75 mg by mouth daily.     Yes Historical Provider, MD  diphenhydrAMINE (BENADRYL) 25 MG tablet Take 25 mg by mouth every 6 (six) hours as needed for itching.   Yes Historical Provider, MD  ferrous sulfate 325 (65 FE) MG tablet Take 325 mg by mouth daily.   Yes Historical  Provider, MD  Fluticasone-Salmeterol (ADVAIR) 250-50 MCG/DOSE AEPB Inhale 1 puff into the lungs every 12 (twelve) hours.   Yes Historical Provider, MD  folic acid (FOLVITE) 1 MG tablet Take 1 mg by mouth daily.   Yes Historical Provider, MD  furosemide (LASIX) 40 MG tablet Take 40 mg by mouth daily.     Yes Historical Provider, MD  GARLIC PO Take 1 tablet by mouth daily.   Yes Historical Provider, MD  ipratropium (ATROVENT) 0.02 % nebulizer solution Take 500 mcg by nebulization 4 (four) times daily.    Yes Historical Provider, MD  methotrexate (RHEUMATREX) 2.5 MG tablet Take 20 mg by mouth once a week. On Wednesday. Caution:Chemotherapy. Protect from light.   Yes Historical Provider, MD  Omega-3 Fatty Acids (FISH OIL PO) Take 1 capsule by mouth daily.   Yes Historical Provider, MD  potassium chloride SA (K-DUR,KLOR-CON) 20 MEQ tablet Take 20 mEq by mouth daily.   Yes Historical Provider, MD    Current Facility-Administered Medications  Medication Dose Route Frequency Provider Last Rate Last Dose  . 0.9 %  sodium chloride infusion   Intravenous Continuous Milana Obey, MD 75 mL/hr at 02/25/13 0322    . acetaminophen (TYLENOL) tablet 500 mg  500 mg Oral Q4H PRN Milana Obey, MD      . albuterol (PROVENTIL HFA;VENTOLIN HFA) 108 (90 BASE) MCG/ACT inhaler 2 puff  2 puff Inhalation Q6H PRN Milana Obey, MD      . albuterol (PROVENTIL) (5 MG/ML) 0.5% nebulizer solution 2.5 mg  2.5 mg Nebulization Q4H PRN Mila Homer  Sudie Bailey, MD      . barrier cream (non-specified) 1 application  1 application Topical BID PRN Milana Obey, MD      . clopidogrel (PLAVIX) tablet 75 mg  75 mg Oral Q breakfast Milana Obey, MD   75 mg at 02/25/13 0845  . diphenhydrAMINE (BENADRYL) capsule 25 mg  25 mg Oral Q6H PRN Milana Obey, MD      . ferrous sulfate tablet 325 mg  325 mg Oral Daily Milana Obey, MD   325 mg at 02/25/13 1040  . fluconazole (DIFLUCAN) tablet 100 mg  100 mg Oral  Daily Milana Obey, MD   100 mg at 02/25/13 1039  . folic acid (FOLVITE) tablet 1 mg  1 mg Oral Daily Milana Obey, MD   1 mg at 02/25/13 1040  . furosemide (LASIX) tablet 40 mg  40 mg Oral Daily Milana Obey, MD   40 mg at 02/25/13 1040  . HYDROcodone-acetaminophen (NORCO) 10-325 MG per tablet 1 tablet  1 tablet Oral Q4H PRN Milana Obey, MD   1 tablet at 02/25/13 0845  . ipratropium (ATROVENT) nebulizer solution 500 mcg  500 mcg Nebulization QID Milana Obey, MD   500 mcg at 02/25/13 1109  . mometasone-formoterol (DULERA) 100-5 MCG/ACT inhaler 2 puff  2 puff Inhalation BID Milana Obey, MD   2 puff at 02/25/13 0703  . potassium chloride SA (K-DUR,KLOR-CON) CR tablet 20 mEq  20 mEq Oral Daily Milana Obey, MD   20 mEq at 02/25/13 1040    Allergies as of 03/03/2013 - Review Complete 02/28/2013  Allergen Reaction Noted  . Aspirin      Past Medical History  Diagnosis Date  . Arthritis   . Joint pain   . Stroke   . Ulcer   . COPD (chronic obstructive pulmonary disease)   . Diabetes mellitus   . Carotid artery occlusion   . Hypertension   . Psoriasis   . DVT (deep venous thrombosis)   . Congestive heart failure   . Venous insufficiency     Past Surgical History  Procedure Laterality Date  . Carotid stent  2012  . Foot surgery      "club foot"  . Abdominal hysterectomy    . Bladder surgery      Family History  Problem Relation Age of Onset  . Colon cancer Neg Hx     History   Social History  . Marital Status: Widowed    Spouse Name: N/A    Number of Children: N/A  . Years of Education: N/A   Occupational History  . Not on file.   Social History Main Topics  . Smoking status: Current Every Day Smoker -- 0.50 packs/day    Types: Cigarettes  . Smokeless tobacco: Former Neurosurgeon    Quit date: 12/30/2012     Comment: pt states that she is trying to quit  . Alcohol Use: No  . Drug Use: No  . Sexual Activity: Not on file    Other Topics Concern  . Not on file   Social History Narrative  . No narrative on file     ROS:  General: + for anorexia, fatigue, weakness. Eyes: Negative for vision changes.  ENT: Negative for hoarseness, difficulty swallowing , nasal congestion. CV: Negative for chest pain, angina, palpitations. + dyspnea on exertion, peripheral edema.  Respiratory: Negative for dyspnea at rest. + dyspnea on exertion, cough, sputum, wheezing.  GI: See  history of present illness. GU:  Negative for dysuria, hematuria, urinary incontinence, urinary frequency, nocturnal urination.  MS: Negative for joint pain, low back pain.  Derm: c/o painful skin lesions Neuro: Negative for weakness, abnormal sensation, seizure, frequent headaches, memory loss, confusion.  Psych: Negative for anxiety, depression, suicidal ideation, hallucinations.  Endo: Negative for unusual weight change.  Heme: Negative for bruising or bleeding. Allergy: Negative for rash or hives.       Physical Examination: Vital signs in last 24 hours: Temp:  [98.2 F (36.8 C)-100 F (37.8 C)] 98.2 F (36.8 C) (12/16 0649) Pulse Rate:  [91-108] 92 (12/16 0849) Resp:  [18-31] 22 (12/16 0649) BP: (84-147)/(32-98) 112/40 mmHg (12/16 0649) SpO2:  [95 %-100 %] 95 % (12/16 1109) Weight:  [247 lb 9.6 oz (112.311 kg)] 247 lb 9.6 oz (112.311 kg) (12/15 2049) Last BM Date: 02/16/2013  General: morbidly obese, wf in no acute distress.  Head: Normocephalic, atraumatic.   Eyes: Conjunctiva pale, no icterus. Mouth: Oropharyngeal mucosa dry and pink , no lesions erythema or exudate. Neck: Supple without thyromegaly, masses, or lymphadenopathy.  Lungs: scattered wheezes, distant breath sounds.   Heart: Regular rate and rhythm, no murmurs rubs or gallops.  Abdomen: Bowel sounds are normal, nontender, nondistended, exam limited due to patient laying on her side with significant pain in the perineal area. +bowel sounds no rebound or guarding.    Rectal: large liquid green stool while present. Heme negative. Several external lesions noted in the perineal area, large denuded areas.  Extremities: 2+ edema. Left shin with dressing, oozing apparent. No lower extremity clubbing, deformity.  Neuro: Alert and oriented x 4 , grossly normal neurologically.  Skin: Warm and dry, no rash or jaundice.   Psych: Alert and cooperative, normal mood and affect.        Intake/Output from previous day: 12/15 0701 - 12/16 0700 In: 840 [P.O.:240; Blood:600] Out: -  Intake/Output this shift:    Lab Results: CBC  Recent Labs  02/10/2013 1655 02/25/13 0546  WBC 2.6* 2.5*  HGB 4.8* 6.3*  HCT 15.5* 19.7*  MCV 96.9 93.4  PLT 211 179   BMET  Recent Labs  02/23/2013 1655 02/25/13 0546  NA 139 140  K 4.3 4.4  CL 104 106  CO2 24 27  GLUCOSE 102* 101*  BUN 31* 26*  CREATININE 1.62* 1.56*  CALCIUM 8.9 8.6      Lab Results  Component Value Date   LIPASE 16 01/15/2013        Imaging Studies: Dg Chest Port 1 View  03/09/2013   CLINICAL DATA:  Shortness of Breath  EXAM: PORTABLE CHEST - 1 VIEW  COMPARISON:  01/15/2013  FINDINGS: Cardiomegaly. Central mild vascular congestion and mild interstitial prominence without convincing pulmonary edema. Stable left basilar atelectasis. No segmental infiltrate.  IMPRESSION: Cardiomegaly. No convincing pulmonary edema. Left basilar atelectasis. No segmental infiltrate.   Electronically Signed   By: Natasha Mead M.D.   On: 03/02/2013 17:03  [4 week]   Impression: 72 y/o female with numerous chronic medical conditions as outlined above who presented with complaints of weakness. Hgb noted to have dropped from 9 last month to 4 currently. She has received two units of prbcs. Heme negative X 1. No overt GI bleeding at this time. She is on Plavix but denies ASA/NSAIDs. Complains of anorexia but no other GI symptoms. She is on chronic methotrexate which is not likely source of significant drop in her Hgb. She is at  increased risk  of PUD.   She has significant skin breakdown/ulceration.   Plan: 1. Consider wound care evaluation. 2. Heme stool X 3. 3. PPI. 4. Consider EGD for significant drop in Hgb.  5. Check LFTs, haptoglobin/LDH to rule out hemolytic anemia.  6. Bowel preparation in the setting of her significant perineal skin lesions would be difficult to do.    LOS: 1 day   Tana Coast  02/25/2013, 12:10 PM

## 2013-02-26 ENCOUNTER — Encounter (HOSPITAL_COMMUNITY): Payer: Self-pay

## 2013-02-26 ENCOUNTER — Encounter (HOSPITAL_COMMUNITY): Admission: EM | Disposition: E | Payer: Self-pay | Source: Home / Self Care | Attending: Family Medicine

## 2013-02-26 DIAGNOSIS — K319 Disease of stomach and duodenum, unspecified: Secondary | ICD-10-CM

## 2013-02-26 DIAGNOSIS — D649 Anemia, unspecified: Secondary | ICD-10-CM

## 2013-02-26 DIAGNOSIS — K210 Gastro-esophageal reflux disease with esophagitis: Secondary | ICD-10-CM

## 2013-02-26 DIAGNOSIS — R630 Anorexia: Secondary | ICD-10-CM

## 2013-02-26 HISTORY — PX: ESOPHAGOGASTRODUODENOSCOPY: SHX5428

## 2013-02-26 LAB — BASIC METABOLIC PANEL
Calcium: 8.9 mg/dL (ref 8.4–10.5)
GFR calc Af Amer: 45 mL/min — ABNORMAL LOW (ref >90)
GFR calc non Af Amer: 39 mL/min — ABNORMAL LOW (ref >90)
Glucose, Bld: 91 mg/dL (ref 70–99)
Potassium: 4 mEq/L (ref 3.5–5.1)
Sodium: 139 mEq/L (ref 135–145)

## 2013-02-26 LAB — CBC
Hemoglobin: 8.7 g/dL — ABNORMAL LOW (ref 12.0–15.0)
MCH: 29.7 pg (ref 26.0–34.0)
MCV: 91.5 fL (ref 78.0–100.0)
Platelets: 163 10*3/uL (ref 150–400)
RBC: 2.93 MIL/uL — ABNORMAL LOW (ref 3.87–5.11)
WBC: 1.9 10*3/uL — ABNORMAL LOW (ref 4.0–10.5)

## 2013-02-26 LAB — PREPARE RBC (CROSSMATCH)

## 2013-02-26 SURGERY — EGD (ESOPHAGOGASTRODUODENOSCOPY)
Anesthesia: Moderate Sedation

## 2013-02-26 MED ORDER — SODIUM CHLORIDE 0.9 % IV SOLN
INTRAVENOUS | Status: DC
  Administered 2013-02-26: 16:00:00 via INTRAVENOUS

## 2013-02-26 MED ORDER — ONDANSETRON HCL 4 MG/2ML IJ SOLN
INTRAMUSCULAR | Status: DC | PRN
  Administered 2013-02-26: 4 mg via INTRAVENOUS

## 2013-02-26 MED ORDER — MEPERIDINE HCL 100 MG/ML IJ SOLN
INTRAMUSCULAR | Status: DC | PRN
  Administered 2013-02-26 (×2): 25 mg via INTRAVENOUS

## 2013-02-26 MED ORDER — STERILE WATER FOR IRRIGATION IR SOLN
Status: DC | PRN
  Administered 2013-02-26: 16:00:00

## 2013-02-26 MED ORDER — ONDANSETRON HCL 4 MG/2ML IJ SOLN
INTRAMUSCULAR | Status: AC
  Filled 2013-02-26: qty 2

## 2013-02-26 MED ORDER — MIDAZOLAM HCL 5 MG/5ML IJ SOLN
INTRAMUSCULAR | Status: DC | PRN
  Administered 2013-02-26: 1 mg via INTRAVENOUS
  Administered 2013-02-26: 2 mg via INTRAVENOUS

## 2013-02-26 MED ORDER — FUROSEMIDE 10 MG/ML IJ SOLN
40.0000 mg | Freq: Once | INTRAMUSCULAR | Status: AC
  Administered 2013-02-26: 40 mg via INTRAVENOUS
  Filled 2013-02-26: qty 4

## 2013-02-26 MED ORDER — MEPERIDINE HCL 100 MG/ML IJ SOLN
INTRAMUSCULAR | Status: AC
  Filled 2013-02-26: qty 2

## 2013-02-26 MED ORDER — MIDAZOLAM HCL 5 MG/5ML IJ SOLN
INTRAMUSCULAR | Status: AC
  Filled 2013-02-26: qty 10

## 2013-02-26 MED ORDER — BUTAMBEN-TETRACAINE-BENZOCAINE 2-2-14 % EX AERO
INHALATION_SPRAY | CUTANEOUS | Status: DC | PRN
  Administered 2013-02-26: 2 via TOPICAL

## 2013-02-26 NOTE — Progress Notes (Signed)
Discussed with patient. EGD planned for today to rule out UGI bleed and evaluate anorexia.  I have discussed the risks, alternatives, benefits with regards to but not limited to the risk of reaction to medication, bleeding, infection, perforation and the patient is agreeable to proceed. Written consent to be obtained.

## 2013-02-26 NOTE — Plan of Care (Signed)
Pt is only on aspirin for anticoagulant treatments.  Pt's hemoglobin is not stable - so blood thinner would not be safe at this time.  Pt has multiple wounds on legs - so SCD cannot be applied.

## 2013-02-26 NOTE — Op Note (Signed)
[facility] [address] [place], [address]   ENDOSCOPY PROCEDURE REPORT  PATIENT: Jill, Robinson  MR#: [id number] BIRTHDATE: 1941/03/07 , 72  yrs. old GENDER: Female ENDOSCOPIST: R.  Roetta Sessions, MD FACP FACG REFERRED BY:  Gareth Morgan, M.D. PROCEDURE DATE:  02/17/2013 PROCEDURE:     EGD with duodenal biopsy  INDICATIONS:      Anorexia; profound anemia with Hemoccult negative  INFORMED CONSENT:   The risks, benefits, limitations, alternatives and imponderables have been discussed.  The potential for biopsy, esophogeal dilation, etc. have also been reviewed.  Questions have been answered.  All parties agreeable.  Please see the history and physical in the medical record for more information.  MEDICATIONS: Versed 3 mg IV and Demerol 50 mg IV in divided doses. Cetacaine spray. Zofran 4 mg IV  DESCRIPTION OF PROCEDURE:   The [id number] ([id number])  endoscope was introduced through the mouth and advanced to the second portion of the duodenum without difficulty or limitations.  The mucosal surfaces were surveyed very carefully during advancement of the scope and upon withdrawal.  Retroflexion view of the proximal stomach and esophagogastric junction was performed.      FINDINGS:   Single 1 cm distal esophageal erosion;  otherwise normal esophagus. Stomach empty. Normal gastric mucosa. Patent pylorus. Examination of bulb and second portion revealed a 4 mm adenomatous appearing nodule in the posterior bulb otherwise D1 and D2 appeared normal  THERAPEUTIC / DIAGNOSTIC MANEUVERS PERFORMED:  The duodenal nodule was biopsied.   COMPLICATIONS:  None  IMPRESSION: Erosive reflux esophagitis (mild). Duodenal nodule-status post biopsy  RECOMMENDATIONS:  Continue twice a day proton pump inhibitor therapy. Advance diet. No further inpatient GI evaluation. I do recommend a screening colonoscopy as an outpatient at a later date once she is in better overall  shape.    _______________________________ R. Roetta Sessions, MD FACP [facility] eSigned:  R. Roetta Sessions, MD FACP [facility] 02/28/2013 5:09 PM     CC:

## 2013-02-26 NOTE — Progress Notes (Signed)
Jill Robinson ZOX:[id number] DOB: May 26, 1940 DOA: 03/04/2013 PCP: Milana Obey, MD   Subjective: This lady has had 4 units blood transfusion and she feels somewhat better but is concerned as to why she is become anemic. I agree with her but this is somewhat of a puzzle. I think the most likely reason is methotrexate. She would he does not have any signs of overt or obvious GI bleed although I think endoscopy would be useful. Her anemia is normocytic and likely to represent anemia of chronic disease or bone marrow suppression. Her white count of being depressed is consistent with methotrexate toxicity.           Physical Exam: Blood pressure 117/60, pulse 104, temperature 99.3 F (37.4 C), temperature source Oral, resp. rate 20, height 5\' 3"  (1.6 m), weight 112.311 kg (247 lb 9.6 oz), SpO2 96.00%. She looks dyspneic at rest to some degree. She is hemodynamically stable. Lungs show bilateral wheezing and thankfully her lungs are not tight. She has a few scattered crackles. She is possibly clinically and mild congestive heart failure. She is alert and oriented. I did not examine her wound but this has been seen by the wound care specialist and has made recommendations.   Investigations:  No results found for this or any previous visit (from the past 240 hour(s)).   Basic Metabolic Panel:  Recent Labs  81/19/14 0546 03/04/2013 0546  NA 140 139  K 4.4 4.0  CL 106 102  CO2 27 27  GLUCOSE 101* 91  BUN 26* 20  CREATININE 1.56* 1.35*  CALCIUM 8.6 8.9   Liver Function Tests:  Recent Labs  02/25/13 0546  AST 11  ALT 5  ALKPHOS 79  BILITOT 0.8  PROT 5.7*  ALBUMIN 2.4*     CBC:  Recent Labs  03/06/2013 1655 02/25/13 0546 03/09/2013 0546  WBC 2.6* 2.5* 1.9*  NEUTROABS 1.2*  --   --   HGB 4.8* 6.3* 8.7*  HCT 15.5* 19.7* 26.8*  MCV 96.9 93.4 91.5  PLT 211 179 163    Dg Chest Port 1 View  03/11/2013   CLINICAL DATA:  Shortness of Breath  EXAM: PORTABLE CHEST  - 1 VIEW  COMPARISON:  01/15/2013  FINDINGS: Cardiomegaly. Central mild vascular congestion and mild interstitial prominence without convincing pulmonary edema. Stable left basilar atelectasis. No segmental infiltrate.  IMPRESSION: Cardiomegaly. No convincing pulmonary edema. Left basilar atelectasis. No segmental infiltrate.   Electronically Signed   By: Natasha Mead M.D.   On: 02/23/2013 17:03      Medications: I have reviewed the patient's current medications.  Impression: 1. Anemia, likely due to methotrexate toxicity. Need to rule out GI bleed. Status post 4 units blood transfusion. 2. Yeast infection of the intertriginous areas. 3. Document diabetes mellitus. 4. Morbid obesity. 5. Superficial skin ulcer of the left lower leg. 6. Mild dehydration, improving. 7. Oxygen-dependent COPD, appears to be stable.     Plan: 1. I think she would benefit from further 2 units of blood transfusion which I will give her today. 2. Agree with endoscopy by Dr. Darrick Penna.  Consultants:  Gastroenterology, Dr. Darrick Penna.   Procedures:  None.                     Code Status: Full code.  Family Communication: Discussed plan with patient at the bedside.   Disposition Plan: Home when medically stable.  Time spent: 20 minutes.   LOS: 2 days   Mosie Angus C Pager  [phone]  03-25-2013, 8:44 AM

## 2013-02-26 NOTE — Progress Notes (Signed)
Notified Dr. Juanetta Gosling of patient having a fever before blood adminsitered.  Ordered to give tylenol before administration and watch patient for reaction once blood given.  Will continue to monitor patient.

## 2013-02-26 NOTE — Plan of Care (Signed)
Plan discussed with Endo.  Agreed it's ok to give 1 of 2 units of blood before EGD.  Pt not scheduled till 1500 or so.

## 2013-02-26 NOTE — Progress Notes (Signed)
Jill, Robinson NO.:  [id number]  MEDICAL RECORD NO.:  [id number]  LOCATION:  A330                          FACILITY:  [facility]  PHYSICIAN:  Mila Homer. Sudie Bailey, M.D.DATE OF BIRTH:  1940/05/18  DATE OF PROCEDURE: DATE OF DISCHARGE:                                PROGRESS NOTE   SUBJECTIVE:  She feels a good deal better today.  She presented to the ER last night with a hemoglobin of 4.8.  She is still bothered by the pain caused by infection in her inguinal and peri-inguinal areas.  OBJECTIVE:  GENERAL:  She is supine in bed.  Her color is better.  Her speech is normal.  Her granddaughter is with her. VITAL SIGNS:  Her temperature is 98.2, pulse 92, respiratory rate 22, blood pressure 112/40. HEART:  Her heart has a regular rhythm with a rate of 80. CHEST:  Her lungs appear to be clear throughout. ABDOMEN:  Soft, but obese.  She still has significant erythema and skin breakdown and maceration in the intertriginous zone areas of the inguinal regions bilaterally and also in the perianal region.  Her hemoglobin is 6.3, up from 4.8.  Her white cell count is stable at 2.5.  Her BUN is 26 with a creatinine of 1.56 compared to 31 and 1.62 the last night.  ASSESSMENT: 1. Severe anemia, probably secondary to methotrexate toxicity. 2. Severe yeast infection in the intertriginous areas. 3. Type 2 diabetes, well controlled. 4. Dehydration, improved. 5. Morbid obesity. 6. Renal insufficiency secondary to dehydration.  PLAN:  Continue with IV fluids.  She will need two more units of blood today.  I am having GI see her, but if her evaluation shows no cause of blood loss, then I think it is more likely this is methotrexate toxicity, as noted above.  I have stopped her methotrexate and may have to have Hematology/Oncology see her as well.     Mila Homer. Sudie Bailey, M.D.     SDK/MEDQ  D:  02/25/2013  T:  02/18/2013  Job:  [id number]

## 2013-02-27 ENCOUNTER — Inpatient Hospital Stay (HOSPITAL_COMMUNITY): Payer: Medicare Other

## 2013-02-27 LAB — COMPREHENSIVE METABOLIC PANEL
ALT: 5 U/L (ref 0–35)
AST: 13 U/L (ref 0–37)
Alkaline Phosphatase: 84 U/L (ref 39–117)
CO2: 28 mEq/L (ref 19–32)
Chloride: 99 mEq/L (ref 96–112)
GFR calc Af Amer: 49 mL/min — ABNORMAL LOW (ref >90)
GFR calc non Af Amer: 42 mL/min — ABNORMAL LOW (ref >90)
Glucose, Bld: 95 mg/dL (ref 70–99)
Potassium: 3.7 mEq/L (ref 3.5–5.1)
Sodium: 138 mEq/L (ref 135–145)

## 2013-02-27 LAB — TYPE AND SCREEN
ABO/RH(D): B POS
Unit division: 0
Unit division: 0
Unit division: 0

## 2013-02-27 LAB — CBC
HCT: 32.1 % — ABNORMAL LOW (ref 36.0–46.0)
Hemoglobin: 10.4 g/dL — ABNORMAL LOW (ref 12.0–15.0)
Platelets: 123 10*3/uL — ABNORMAL LOW (ref 150–400)
RBC: 3.54 MIL/uL — ABNORMAL LOW (ref 3.87–5.11)
WBC: 3.7 10*3/uL — ABNORMAL LOW (ref 4.0–10.5)

## 2013-02-27 NOTE — Evaluation (Signed)
Physical Therapy Evaluation Patient Details Name: Jill Robinson MRN: [id number] DOB: 1940/06/17 Today's Date: 02/27/2013 Time: 5621-3086 PT Time Calculation (min): 45 min  PT Assessment / Plan / Recommendation History of Present Illness  Pt is admitted  due to severe anemia.  In addition, she has multiple wounds dispersed over her body.  She has COPD and is O2 dependent.  She lives with her children and is able to ambulate short distances with a walker. She is morbidly obese.  Clinical Impression  Pt ia alert and oriented, c/o severe generalized pain with any movement of her body in the bed due to multiple open wounds.  She is found to be dyspneic at rest, although O2 sat=98%.  She is requiring total assist to assume sitting position and is unable to stand due to exertional requirement and level of pain.  She will need SNF at d/c before being able to return home.    PT Assessment  Patient needs continued PT services    Follow Up Recommendations  SNF    Does the patient have the potential to tolerate intense rehabilitation      Barriers to Discharge   pt will be unable to manage at home due to multiple medical issues    Equipment Recommendations  None recommended by PT    Recommendations for Other Services OT consult   Frequency Min 3X/week    Precautions / Restrictions Precautions Precautions: Fall Precaution Comments: orange contact Restrictions Weight Bearing Restrictions: No Other Position/Activity Restrictions: pt is found to have a wound over plantar surface of left heel...we will need to minimize weight bearing in this region.   Pertinent Vitals/Pain       Mobility  Bed Mobility Bed Mobility: Supine to Sit;Sit to Supine Supine to Sit: 1: +1 Total assist;HOB elevated Sit to Supine: 1: +1 Total assist;HOB flat Transfers Transfers: Not assessed Details for Transfer Assistance: pt is only able to sit, partially reclined to the left...she is unable to stand     Exercises     PT Diagnosis: Difficulty walking;Generalized weakness;Acute pain  PT Problem List: Decreased strength;Decreased range of motion;Decreased activity tolerance;Decreased mobility;Cardiopulmonary status limiting activity;Obesity;Pain PT Treatment Interventions: Functional mobility training;Therapeutic exercise     PT Goals(Current goals can be found in the care plan section) Acute Rehab PT Goals Patient Stated Goal: would like to get back home PT Goal Formulation: With patient/family Time For Goal Achievement: 03/13/13 Potential to Achieve Goals: Fair  Visit Information  Last PT Received On: 02/27/13 History of Present Illness: Pt is admitted  due to severe anemia.  In addition, she has multiple wounds dispersed over her body.  She has COPD and is O2 dependent.  She lives with her children and is able to ambulate short distances with a walker. She is morbidly obese.       Prior Functioning  Home Living Family/patient expects to be discharged to:: Skilled nursing facility Prior Function Level of Independence: Needs assistance Communication Communication: No difficulties    Cognition  Cognition Arousal/Alertness: Awake/alert Behavior During Therapy: WFL for tasks assessed/performed Overall Cognitive Status: Within Functional Limits for tasks assessed    Extremity/Trunk Assessment Lower Extremity Assessment Lower Extremity Assessment: Generalized weakness   Balance Balance Balance Assessed: No  End of Session PT - End of Session Equipment Utilized During Treatment: Oxygen Activity Tolerance: Patient limited by pain Patient left: in bed Nurse Communication: Mobility status  GP     Konrad Penta 02/27/2013, 1:52 PM

## 2013-02-27 NOTE — Progress Notes (Signed)
Subjective:  No complaints. States she has had some diarrhea for few days prior to admission. Denies abdominal pain. Last documented stool from 02/25/13.  Objective: Vital signs in last 24 hours: Temp:  [98 F (36.7 C)-101.7 F (38.7 C)] 100.4 F (38 C) (12/18 0622) Pulse Rate:  [92-112] 101 (12/18 0622) Resp:  [16-26] 22 (12/18 0622) BP: (93-125)/(27-76) 105/55 mmHg (12/18 0622) SpO2:  [93 %-99 %] 98 % (12/18 0720) Weight:  [247 lb (112.038 kg)] 247 lb (112.038 kg) (12/17 1608) Last BM Date: 02/25/13 General:   Alert,  Chronically ill-appearing WF. NAD Head:  Normocephalic and atraumatic. Eyes:  Sclera clear, no icterus.   Abdomen:  Soft, nontender and nondistended.  Normal bowel sounds, without guarding, and without rebound.   Extremities:  Without deformity. 2+ edema. Neurologic:  Alert and  oriented x4;  grossly normal neurologically. Skin:  Intact without significant lesions or rashes. Psych:  Alert and cooperative. Normal mood and affect.  Intake/Output from previous day: 12/17 0701 - 12/18 0700 In: 1200 [I.V.:500; Blood:700] Out: 1750 [Urine:1750] Intake/Output this shift:    Lab Results: CBC  Recent Labs  02/25/13 0546 03/12/2013 0546 02/27/13 0546  WBC 2.5* 1.9* 3.7*  HGB 6.3* 8.7* 10.4*  HCT 19.7* 26.8* 32.1*  MCV 93.4 91.5 90.7  PLT 179 163 123*   BMET  Recent Labs  02/25/13 0546 02/10/2013 0546 02/27/13 0546  NA 140 139 138  K 4.4 4.0 3.7  CL 106 102 99  CO2 27 27 28   GLUCOSE 101* 91 95  BUN 26* 20 17  CREATININE 1.56* 1.35* 1.26*  CALCIUM 8.6 8.9 8.5   LFTs  Recent Labs  02/25/13 0546 02/27/13 0546  BILITOT 0.8 0.9  BILIDIR 0.3  --   IBILI 0.5  --   ALKPHOS 79 84  AST 11 13  ALT 5 5  PROT 5.7* 6.0  ALBUMIN 2.4* 2.3*   No results found for this basename: LIPASE,  in the last 72 hours PT/INR No results found for this basename: LABPROT, INR,  in the last 72 hours    Imaging Studies: Dg Chest Port 1 View  02/27/2013   CLINICAL  DATA:  Pulmonary edema.  CHF.  EXAM: PORTABLE CHEST - 1 VIEW  COMPARISON:  03/07/2013.  FINDINGS: The cardiopericardial silhouette remains enlarged. Lung volumes are lower than on prior exam. Pulmonary vascular congestion with interstitial and basilar alveolar edema is present. The overall appearance is compatible with moderate CHF. There may be small pleural effusions as well.  IMPRESSION: Moderate CHF.   Electronically Signed   By: Andreas Newport M.D.   On: 02/27/2013 09:24   Dg Chest Port 1 View  02/20/2013   CLINICAL DATA:  Shortness of Breath  EXAM: PORTABLE CHEST - 1 VIEW  COMPARISON:  01/15/2013  FINDINGS: Cardiomegaly. Central mild vascular congestion and mild interstitial prominence without convincing pulmonary edema. Stable left basilar atelectasis. No segmental infiltrate.  IMPRESSION: Cardiomegaly. No convincing pulmonary edema. Left basilar atelectasis. No segmental infiltrate.   Electronically Signed   By: Natasha Mead M.D.   On: 02/20/2013 17:03  [2 weeks]   Assessment: 72 y/o female with numerous chronic medical conditions as outlined previously who presented with complaints of weakness. Hgb noted to have dropped from 9 last month to 4 currently. She has received six units of prbcs. Heme negative X 1. No overt GI bleeding at this time. She is on Plavix but denies ASA/NSAIDs. Complains of anorexia but no other GI symptoms. However daughter brought  to our attention that she has been having some diarrhea. Patient states has been occuring for few days to a week. None noted in past 24 hours but did have one loose stool two days ago. She is at increased risk of C.Diff.  Anemia, H/H stable.  Plan: 1. Stool studies ordered.    LOS: 3 days   Tana Coast  02/27/2013, 12:26 PM

## 2013-02-27 NOTE — Clinical Social Work Note (Signed)
CSW faxed documents to [facility] for review and requested authorization for SNF rehab.  Attempted to speak w son at patient request, was unable to reach.  Left VM.  Santa Genera, LCSW Clinical Social Worker [phone])

## 2013-02-27 NOTE — Clinical Social Work Psychosocial (Signed)
    Clinical Social Work Department BRIEF PSYCHOSOCIAL ASSESSMENT 02/27/2013  Patient:  Jill Robinson, Jill Robinson     Account Number:  [id number]     Admit date:  02/15/2013  Clinical Social Worker:  Santa Genera, CLINICAL SOCIAL WORKER  Date/Time:  02/27/2013 03:00 PM  Referred by:  CSW  Date Referred:  02/27/2013 Referred for  SNF Placement   Other Referral:   Interview type:  Patient Other interview type:   Attempted to reach son, was at work, did not answer or return call.  Left VM and requested call back.    PSYCHOSOCIAL DATA Living Status:  FAMILY Admitted from facility:   Level of care:   Primary support name:  Durwin Nora Primary support relationship to patient:  CHILD, ADULT Degree of support available:   Supportive family, may not be able to manage her current physical needs.    CURRENT CONCERNS Current Concerns  Post-Acute Placement   Other Concerns:    SOCIAL WORK ASSESSMENT / PLAN CSW spoke briefly to patient at bedside, patient oriented but very short of breath and sleepy.  Was able to give brief acknowledgement of need for SNF rehab, wants placement in [facility].  Asked that CSW call son for further details.  CSW explained placement process and copays to patient, attempted to reach son but was unable to do so.  Will try again in the AM and obtain more history. Per RN, patient has been cared for at home by son and his family.  At this point, she cannot return there due to her weakness and deconditioning and needs SNF rehab per PT. CSW will continue to work w patient and family to address discharge planning needs  Explained that insurance will need to authorize placement, have submitted documents to [facility].   Assessment/plan status:  Psychosocial Support/Ongoing Assessment of Needs Other assessment/ plan:   Information/referral to community resources:   [facility] list    PATIENT'S/FAMILY'S RESPONSE TO PLAN OF CARE: Patient wants son involved in  decision making, was unable to reach son as he is at work.  Patient agreed to bed search for [facility] rehab.        Santa Genera, LCSW Clinical Social Worker [phone])

## 2013-02-27 NOTE — Progress Notes (Signed)
  Jill Robinson LKG:[id number] DOB: [birth year] DOA: 03/10/2013 PCP: Milana Obey, MD   Subjective: This lady has had 6 units blood transfusion mouth since admission. Her hemoglobin is acceptable. She still feels unwell and mainly it appears that she feels weak and dyspneic. Endoscopy yesterday was rather unrevealing. She really does not seem to have any overt signs of GI bleed.           Physical Exam: Blood pressure 105/55, pulse 101, temperature 100.4 F (38 C), temperature source Oral, resp. rate 22, height 5\' 3"  (1.6 m), weight 112.038 kg (247 lb), SpO2 98.00%. She looks dyspneic at rest to some degree. She is hemodynamically stable. Lungs show bilateral wheezing and thankfully her lungs are not tight. She has a few scattered crackles. She is possibly clinically and mild congestive heart failure. She is alert and oriented. I did not examine her wound but this has been seen by the wound care specialist and has made recommendations.   Investigations:  No results found for this or any previous visit (from the past 240 hour(s)).   Basic Metabolic Panel:  Recent Labs  66/44/03 0546 02/27/13 0546  NA 139 138  K 4.0 3.7  CL 102 99  CO2 27 28  GLUCOSE 91 95  BUN 20 17  CREATININE 1.35* 1.26*  CALCIUM 8.9 8.5   Liver Function Tests:  Recent Labs  02/25/13 0546 02/27/13 0546  AST 11 13  ALT 5 5  ALKPHOS 79 84  BILITOT 0.8 0.9  PROT 5.7* 6.0  ALBUMIN 2.4* 2.3*     CBC:  Recent Labs  02/25/2013 1655  March 26, 2013 0546 02/27/13 0546  WBC 2.6*  < > 1.9* 3.7*  NEUTROABS 1.2*  --   --   --   HGB 4.8*  < > 8.7* 10.4*  HCT 15.5*  < > 26.8* 32.1*  MCV 96.9  < > 91.5 90.7  PLT 211  < > 163 123*  < > = values in this interval not displayed.  No results found.    Medications: I have reviewed the patient's current medications.  Impression: 1. Anemia, likely due to methotrexate toxicity.Status post 6 units blood transfusion. 2. Yeast infection of the  intertriginous areas. 3. Diabetes mellitus. 4. Morbid obesity. 5. Superficial skin ulcer of the left lower leg. 6. Mild dehydration, improving. 7. Oxygen-dependent COPD, appears to be stable.     Plan: 1. Discontinue IV fluids. 2. Chest x-ray to make sure she is not in frank pulmonary edema, clinically I doubt this is the case. 3. Physical therapy evaluation. She may well benefit from home health services versus even rehabilitation in a skilled nursing facility..  Consultants:  Gastroenterology, Dr. Darrick Penna.   Procedures:  None.                     Code Status: Full code.  Family Communication: Discussed plan with patient at the bedside.   Disposition Plan: Home when medically stable.  Time spent: 20 minutes.   LOS: 3 days   Wilson Singer Pager [phone]  02/27/2013, 8:58 AM

## 2013-02-27 NOTE — Clinical Social Work Placement (Signed)
    Clinical Social Work Department CLINICAL SOCIAL WORK PLACEMENT NOTE 03/03/2013  Patient:  Jill Robinson, Jill Robinson  Account Number:  [id number] Admit date:  03/03/2013  Clinical Social Worker:  Santa Genera, CLINICAL SOCIAL WORKER  Date/time:  02/27/2013 04:00 PM  Clinical Social Work is seeking post-discharge placement for this patient at the following level of care:   SKILLED NURSING   (*CSW will update this form in Epic as items are completed)   02/27/2013  Patient/family provided with [facility] Department of Clinical Social Work's list of facilities offering this level of care within the geographic area requested by the patient (or if unable, by the patient's family).  02/27/2013  Patient/family informed of their freedom to choose among providers that offer the needed level of care, that participate in Medicare, Medicaid or managed care program needed by the patient, have an available bed and are willing to accept the patient.  02/27/2013  Patient/family informed of [facility]' ownership interest in [facility], as well as of the fact that they are under no obligation to receive care at this facility.  PASARR submitted to [employer] on 02/27/2013 PASARR number received from [employer] on 02/27/2013  FL2 transmitted to all facilities in geographic area requested by pt/family on  02/27/2013 FL2 transmitted to all facilities within larger geographic area on   Patient informed that his/her managed care company has contracts with or will negotiate with  certain facilities, including the following:     Patient/family informed of bed offers received:  02/28/2013 Patient chooses bed at [facility] Physician recommends and patient chooses bed at    Patient to be transferred to  on   Patient to be transferred to facility by   The following physician request were entered in Epic:   Additional Comments: Patient had preexisting PASARR, on 12/19 patient chose [facility], facility unable to get Berkley Harvey so family was asked to consider [facility] which was able to get auth 03/03/13:  [employer] Yehuda Mao) authorized EMS and gave pending auth for SNF placement - confirmed w [facility] that they can accept patient.  MD notified  Santa Genera, LCSW Clinical Social Worker [phone])

## 2013-02-28 LAB — BASIC METABOLIC PANEL
BUN: 19 mg/dL (ref 6–23)
CO2: 30 mEq/L (ref 19–32)
Calcium: 9 mg/dL (ref 8.4–10.5)
Chloride: 100 mEq/L (ref 96–112)
Creatinine, Ser: 1.39 mg/dL — ABNORMAL HIGH (ref 0.50–1.10)
GFR calc Af Amer: 43 mL/min — ABNORMAL LOW (ref >90)
GFR calc non Af Amer: 37 mL/min — ABNORMAL LOW (ref >90)
Glucose, Bld: 87 mg/dL (ref 70–99)
Potassium: 4.3 mEq/L (ref 3.5–5.1)
Sodium: 139 mEq/L (ref 135–145)

## 2013-02-28 LAB — CBC
HCT: 33.1 % — ABNORMAL LOW (ref 36.0–46.0)
Hemoglobin: 10.6 g/dL — ABNORMAL LOW (ref 12.0–15.0)
MCH: 29.9 pg (ref 26.0–34.0)
MCHC: 32 g/dL (ref 30.0–36.0)
MCV: 93.2 fL (ref 78.0–100.0)
Platelets: 150 10*3/uL (ref 150–400)
RBC: 3.55 MIL/uL — ABNORMAL LOW (ref 3.87–5.11)
RDW: 17 % — ABNORMAL HIGH (ref 11.5–15.5)
WBC: 5.9 10*3/uL (ref 4.0–10.5)

## 2013-02-28 MED ORDER — PRO-STAT SUGAR FREE PO LIQD
30.0000 mL | Freq: Three times a day (TID) | ORAL | Status: DC
  Administered 2013-02-28 – 2013-03-04 (×12): 30 mL via ORAL
  Filled 2013-02-28 (×13): qty 30

## 2013-02-28 MED ORDER — ENSURE COMPLETE PO LIQD
237.0000 mL | Freq: Two times a day (BID) | ORAL | Status: DC
  Administered 2013-03-01 – 2013-03-04 (×7): 237 mL via ORAL
  Administered 2013-03-04: 08:00:00 via ORAL

## 2013-02-28 NOTE — Progress Notes (Signed)
Subjective:  Denies BM in two days. No GI complaints.   Objective: Vital signs in last 24 hours: Temp:  [98.5 F (36.9 C)-99.4 F (37.4 C)] 98.5 F (36.9 C) (12/19 0545) Pulse Rate:  [88-100] 88 (12/19 0802) Resp:  [16-24] 22 (12/19 0802) BP: (107-111)/(46-52) 107/46 mmHg (12/19 0545) SpO2:  [92 %-99 %] 98 % (12/19 1100) Last BM Date: 02/25/13 General:   Alert,  Well-developed, well-nourished, pleasant and cooperative in NAD Head:  Normocephalic and atraumatic. Eyes:  Sclera clear, no icterus.   Abdomen:  Soft, nontender and nondistended.   Normal bowel sounds, without guarding, and without rebound.     Intake/Output from previous day: 12/18 0701 - 12/19 0700 In: -  Out: 800 [Urine:800] Intake/Output this shift: Total I/O In: 240 [P.O.:240] Out: -   Lab Results: CBC  Recent Labs  03/18/2013 0546 02/27/13 0546 02/28/13 0619  WBC 1.9* 3.7* 5.9  HGB 8.7* 10.4* 10.6*  HCT 26.8* 32.1* 33.1*  MCV 91.5 90.7 93.2  PLT 163 123* 150   BMET  Recent Labs  Mar 18, 2013 0546 02/27/13 0546 02/28/13 0619  NA 139 138 139  K 4.0 3.7 4.3  CL 102 99 100  CO2 27 28 30   GLUCOSE 91 95 87  BUN 20 17 19   CREATININE 1.35* 1.26* 1.39*  CALCIUM 8.9 8.5 9.0   LFTs  Recent Labs  02/27/13 0546  BILITOT 0.9  ALKPHOS 84  AST 13  ALT 5  PROT 6.0  ALBUMIN 2.3*   No results found for this basename: LIPASE,  in the last 72 hours PT/INR No results found for this basename: LABPROT, INR,  in the last 72 hours    Imaging Studies: Dg Chest Port 1 View  02/27/2013   CLINICAL DATA:  Pulmonary edema.  CHF.  EXAM: PORTABLE CHEST - 1 VIEW  COMPARISON:  03/12/2013.  FINDINGS: The cardiopericardial silhouette remains enlarged. Lung volumes are lower than on prior exam. Pulmonary vascular congestion with interstitial and basilar alveolar edema is present. The overall appearance is compatible with moderate CHF. There may be small pleural effusions as well.  IMPRESSION: Moderate CHF.    Electronically Signed   By: Andreas Newport M.D.   On: 02/27/2013 09:24   Dg Chest Port 1 View  02/27/2013   CLINICAL DATA:  Shortness of Breath  EXAM: PORTABLE CHEST - 1 VIEW  COMPARISON:  01/15/2013  FINDINGS: Cardiomegaly. Central mild vascular congestion and mild interstitial prominence without convincing pulmonary edema. Stable left basilar atelectasis. No segmental infiltrate.  IMPRESSION: Cardiomegaly. No convincing pulmonary edema. Left basilar atelectasis. No segmental infiltrate.   Electronically Signed   By: Natasha Mead M.D.   On: 02/10/2013 17:03  [2 weeks]   Assessment:  72 y/o female with numerous chronic medical conditions as outlined previously who presented with complaints of weakness. Hgb noted to have dropped from 9 last month to 4 currently. She has received six units of prbcs. Heme negative X 1. No overt GI bleeding at this time. She is on Plavix but denies ASA/NSAIDs. Complains of anorexia but no other GI symptoms. EGD this admission showed mild erosive reflux esophagitis, duodenal nodule (path pending).  Daughter brought to our attention that she has been having some diarrhea. Patient states has been occuring for few days to a week. None noted in past 48 hours but did have one loose stool few days ago. She is at increased risk of C.Diff.   Anemia, H/H stable.  Plan: 1. Stool studies pending.  2.  F/u path. 3. Outpatient colonoscopy once overall health has improved.    LOS: 4 days   Tana Coast  02/28/2013, 12:51 PM

## 2013-02-28 NOTE — Progress Notes (Signed)
REVIEWED.  

## 2013-02-28 NOTE — Progress Notes (Signed)
INITIAL NUTRITION ASSESSMENT  DOCUMENTATION CODES Per approved criteria  -Morbid Obesity   INTERVENTION: ProStat 30 ml TID (each 30 ml provides 100 kcal, 15 gr protein) Ensure Complete po BID, each supplement provides 350 kcal and 13 grams of protein RD will follow for nutrition care  NUTRITION DIAGNOSIS:  Increased protein-energy needs related to wound healing  as evidenced by nutrition guidelines  Goal: Pt to meet >/= 90% of their estimated nutrition needs   Monitor:  Po intake, labs and wt trends  Reason for Assessment: Wound healing  72 y.o. female  ASSESSMENT: Pt admitted 12/15 problems include: severe anemia, yeast infection, anorexia, dehydration skin ulcers, venous insufficiency. Weight loss of 6#, 2% past month is not clinically significant. Hx of CHF. She has received 6 units of PRBC's and is s/p EGD 12/17 no significant findings. Duodenal biopsy obtained results pending.  Her po intake 0-25% since admission which is inadequate to meet est needs given per increased protein-energy requirements related to wound healing. She is at risk for malnutrition. Will add oral supplement and protein modular to promote increased nutrition intake.   Height: Ht Readings from Last 1 Encounters:  02/17/2013 5\' 3"  (1.6 m)    Weight: Wt Readings from Last 1 Encounters:  03/04/2013 247 lb (112.038 kg)    Ideal Body Weight: 115# (52.2 kg)  % Ideal Body Weight: 215%  Wt Readings from Last 10 Encounters:  03/03/2013 247 lb (112.038 kg)  02/27/2013 247 lb (112.038 kg)  01/19/13 253 lb 15.5 oz (115.2 kg)  01/02/13 262 lb 5.6 oz (119 kg)  01/08/12 268 lb (121.564 kg)  11/06/08 240 lb (108.863 kg)  10/26/08 239 lb (108.41 kg)  06/26/07 265 lb (120.203 kg)    Usual Body Weight: 240-260#  % Usual Body Weight: 98%  BMI:  Body mass index is 43.76 kg/(m^2). Obesity class III  Estimated Nutritional Needs: Kcal: 1500-1800  Protein: 104-115 gr Fluid: >2000 ml daily  Skin: stasis ulcer  leg, sacrum red   Diet Order: Cardiac  EDUCATION NEEDS: -Education needs addressed   Intake/Output Summary (Last 24 hours) at 02/28/13 1606 Last data filed at 02/28/13 1504  Gross per 24 hour  Intake    480 ml  Output   1200 ml  Net   -720 ml    Last BM:  02/25/13  Labs:   Recent Labs Lab 02/15/2013 0546 02/27/13 0546 02/28/13 0619  NA 139 138 139  K 4.0 3.7 4.3  CL 102 99 100  CO2 27 28 30   BUN 20 17 19   CREATININE 1.35* 1.26* 1.39*  CALCIUM 8.9 8.5 9.0  GLUCOSE 91 95 87    CBG (last 3)  No results found for this basename: GLUCAP,  in the last 72 hours  Scheduled Meds: . albuterol  2.5 mg Nebulization TID  . clopidogrel  75 mg Oral Q breakfast  . fluconazole  100 mg Oral Daily  . folic acid  1 mg Oral Daily  . furosemide  40 mg Oral Daily  . ipratropium  500 mcg Nebulization TID  . mometasone-formoterol  2 puff Inhalation BID  . pantoprazole  40 mg Oral BID AC  . potassium chloride SA  20 mEq Oral Daily    Continuous Infusions:   Past Medical History  Diagnosis Date  . Arthritis   . Joint pain   . Stroke   . Ulcer   . COPD (chronic obstructive pulmonary disease)   . Diabetes mellitus   . Carotid artery occlusion   .  Hypertension   . Psoriasis   . DVT (deep venous thrombosis)   . Congestive heart failure   . Venous insufficiency     Past Surgical History  Procedure Laterality Date  . Carotid stent  2012  . Foot surgery      "club foot"  . Abdominal hysterectomy    . Bladder surgery      Royann Shivers MS,RD,CSG,LDN Office: #[phone] Pager: [phone]

## 2013-02-28 NOTE — Progress Notes (Signed)
PT Cancellation Note  Patient Details Name: Marigny Borre MRN: [id number] DOB: 1940-07-10   Cancelled Treatment:    Reason Eval/Treat Not Completed: Patient declined, no reason specified  Assisted RN with bed mobility for wound dressings with max assistance required from 1120-1130 Pt stated she did not want to complete any therapy today, no reason specified.  Pt explained benefits on completing therapy for increased functional strengthening.  Pt left in bed with call within reach.     Aalyah, [place] 02/28/2013, 11:36 AM

## 2013-03-01 NOTE — Progress Notes (Signed)
NAMEBREANDA, Jill Robinson NO.:  [id number]  MEDICAL RECORD NO.:  [id number]  LOCATION:  A330                          FACILITY:  [facility]  PHYSICIAN:  Mila Homer. Sudie Bailey, M.D.DATE OF BIRTH:  06/08/1940  DATE OF PROCEDURE: DATE OF DISCHARGE:                                PROGRESS NOTE   SUBJECTIVE:  The patient still feels very weak.  OBJECTIVE:  GENERAL:  She is sitting up in bed.  She appears to be oriented and alert. VITAL SIGNS:  Temperature is 98.5, pulse 88, respiratory rate 22, blood pressure 107/46. HEART:  Her heart has a regular rhythm. LUNGS:  Clear throughout. EXTREMITIES:  She still has erythema in the intertriginous areas of the body in the inguinal areas.  This is not nearly as tender to her as it was just several days ago.  ASSESSMENT: 1. Severe anemia felt to be secondary to methotrexate. 2. 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 3. Type 2 diabetes. 4. Dehydration. 5. Yeast infection in the intertriginous zones. 6. Malaise and fatigue.  PLAN:  I talked discharge planning.  She would probably benefit from short- term nursing home placement to get her strength back.  She will also need to see her dermatologist in [facility] once this is cleared so that she and he can determine what type of treatment she should have for her psoriasis, which in the past covered her body.     Mila Homer. Sudie Bailey, M.D.     SDK/MEDQ  D:  02/28/2013  T:  03/01/2013  Job:  [id number]

## 2013-03-01 NOTE — Progress Notes (Signed)
Pt's family concerned that Pt is increasing confused at times. Pt is alert to person and place. Granddaughter stated that this is how pt acted when she had a stroke a couple of years ago. Vital sign stable at this time. MD paged and orders given for a UA and a Head CT in the AM.

## 2013-03-02 ENCOUNTER — Inpatient Hospital Stay (HOSPITAL_COMMUNITY): Payer: Medicare Other

## 2013-03-02 LAB — URINALYSIS, ROUTINE W REFLEX MICROSCOPIC
Bilirubin Urine: NEGATIVE
Glucose, UA: NEGATIVE mg/dL
Nitrite: POSITIVE — AB
Specific Gravity, Urine: 1.01 (ref 1.005–1.030)
Urobilinogen, UA: 0.2 mg/dL (ref 0.0–1.0)
pH: 5.5 (ref 5.0–8.0)

## 2013-03-02 LAB — URINE MICROSCOPIC-ADD ON

## 2013-03-02 MED ORDER — CIPROFLOXACIN HCL 250 MG PO TABS
500.0000 mg | ORAL_TABLET | Freq: Two times a day (BID) | ORAL | Status: DC
  Administered 2013-03-02 (×2): 500 mg via ORAL
  Filled 2013-03-02 (×2): qty 2

## 2013-03-02 NOTE — Progress Notes (Signed)
Pt's family concerned that patient has had a stroke.  Family aware of CT results, but are requesting an MRI.  Notified Dr Renard Matter of request, who will refer to Dr Sudie Bailey.

## 2013-03-02 NOTE — Progress Notes (Signed)
NAMECATERINA, RACINE NO.:  [id number]  MEDICAL RECORD NO.:  [id number]  LOCATION:  A330                          FACILITY:  [facility]  PHYSICIAN:  Shalaine Payson G. Renard Matter, MD   DATE OF BIRTH:  1940/05/16  DATE OF PROCEDURE: DATE OF DISCHARGE:                                PROGRESS NOTE   SUBJECTIVE:  This patient is alert.  She was admitted for severe anemia probably secondary to methotrexate toxicity.  She does have type 2 diabetes which is well controlled, and dehydration which is improved. Renal insufficiency secondary to dehydration.  OBJECTIVE:  VITAL SIGNS:  Blood pressure 100/46, respirations 20, pulse 96, temp 98.2. HEENT:  Negative. NECK:  Supple.  No JVD or thyroid abnormalities. HEART:  Regular rhythm.  No murmurs. LUNGS:  Clear to P and A. ABDOMEN:  Obese but no tenderness.  ASSESSMENT:  The patient was admitted with severe anemia secondary to methotrexate toxicity.  She has type 2 diabetes, dehydration.  PLAN:  To continue to monitor her hemoglobin and hematocrit.  Continue her current meds.  GI Service has seen the patient.  EGD showed mild erosive reflux esophagitis.  Continue current regimen.     Koki Buxton G. Renard Matter, MD     AGM/MEDQ  D:  03/01/2013  T:  03/02/2013  Job:  [id number]

## 2013-03-02 NOTE — Progress Notes (Signed)
NAMEMINKA, KNIGHT NO.:  [id number]  MEDICAL RECORD NO.:  [id number]  LOCATION:  A330                          FACILITY:  [facility]  PHYSICIAN:  Anani Gu G. Renard Matter, MD   DATE OF BIRTH:  [birth year]  DATE OF PROCEDURE: DATE OF DISCHARGE:                                PROGRESS NOTE   SUBJECTIVE:  This patient is alert.  She was admitted with severe anemia secondary to methotrexate toxicity.  She does have type 2 diabetes which is well controlled and dehydration which has improved, renal insufficiency secondary to dehydration.  She has had some urinary discomfort and nursing staff stated that she had an abnormal urine with blood and increased cells today.  OBJECTIVE:  VITAL SIGNS:  Blood pressure 120/55, respirations 22, pulse 103, temp 99.9.  HEENT:  Negative. NECK:  Supple.  No JVD or thyroid abnormalities. HEART:  Regular rhythm.  No murmurs. LUNGS:  Clear to P and A. ABDOMEN:  Obese, but no tenderness.  ASSESSMENT:  The patient was admitted with severe anemia secondary to methotrexate toxicity.  This is improved.  She has type 2 diabetes and dehydration.  She has urinary tract infection.  Plan to continue to monitor hemoglobin and hematocrit and start the patient on p.o. Cipro 500 mg b.i.d.  She is to continue current regimen.     Osamu Olguin G. Renard Matter, MD     AGM/MEDQ  D:  03/02/2013  T:  03/02/2013  Job:  [id number]

## 2013-03-03 ENCOUNTER — Inpatient Hospital Stay (HOSPITAL_COMMUNITY): Payer: Medicare Other

## 2013-03-03 ENCOUNTER — Encounter (HOSPITAL_COMMUNITY): Payer: Self-pay | Admitting: Internal Medicine

## 2013-03-03 DIAGNOSIS — D649 Anemia, unspecified: Secondary | ICD-10-CM

## 2013-03-03 LAB — BLOOD GAS, ARTERIAL
Acid-Base Excess: 8 mmol/L — ABNORMAL HIGH (ref 0.0–2.0)
Bicarbonate: 32.4 mEq/L — ABNORMAL HIGH (ref 20.0–24.0)
FIO2: 50 %
O2 Saturation: 88.4 %
TCO2: 29.1 mmol/L (ref 0–100)
pO2, Arterial: 52.5 mmHg — ABNORMAL LOW (ref 80.0–100.0)

## 2013-03-03 LAB — BASIC METABOLIC PANEL
BUN: 51 mg/dL — ABNORMAL HIGH (ref 6–23)
CO2: 31 mEq/L (ref 19–32)
Calcium: 9 mg/dL (ref 8.4–10.5)
Chloride: 93 mEq/L — ABNORMAL LOW (ref 96–112)
Creatinine, Ser: 1.55 mg/dL — ABNORMAL HIGH (ref 0.50–1.10)

## 2013-03-03 LAB — CBC
MCH: 29.4 pg (ref 26.0–34.0)
MCHC: 30.6 g/dL (ref 30.0–36.0)
MCV: 95.8 fL (ref 78.0–100.0)
Platelets: 642 10*3/uL — ABNORMAL HIGH (ref 150–400)
RBC: 4.02 MIL/uL (ref 3.87–5.11)
WBC: 12.7 10*3/uL — ABNORMAL HIGH (ref 4.0–10.5)

## 2013-03-03 MED ORDER — FUROSEMIDE 10 MG/ML IJ SOLN
40.0000 mg | INTRAMUSCULAR | Status: AC
  Administered 2013-03-03: 40 mg via INTRAVENOUS
  Filled 2013-03-03: qty 4

## 2013-03-03 MED ORDER — PIPERACILLIN-TAZOBACTAM 3.375 G IVPB
INTRAVENOUS | Status: AC
  Filled 2013-03-03: qty 100

## 2013-03-03 MED ORDER — HALOPERIDOL LACTATE 5 MG/ML IJ SOLN
1.0000 mg | INTRAMUSCULAR | Status: DC | PRN
  Administered 2013-03-03 – 2013-03-04 (×2): 4 mg via INTRAVENOUS
  Filled 2013-03-03 (×2): qty 1

## 2013-03-03 MED ORDER — ACETAMINOPHEN 650 MG RE SUPP
650.0000 mg | RECTAL | Status: DC | PRN
  Administered 2013-03-03 – 2013-03-05 (×6): 650 mg via RECTAL
  Filled 2013-03-03 (×6): qty 1

## 2013-03-03 MED ORDER — VANCOMYCIN HCL IN DEXTROSE 1-5 GM/200ML-% IV SOLN
1000.0000 mg | Freq: Once | INTRAVENOUS | Status: DC
  Filled 2013-03-03: qty 200

## 2013-03-03 MED ORDER — DEXTROSE 5 % IV SOLN
1.0000 g | INTRAVENOUS | Status: DC
  Administered 2013-03-03: 1 g via INTRAVENOUS
  Filled 2013-03-03 (×2): qty 10

## 2013-03-03 MED ORDER — FUROSEMIDE 10 MG/ML IJ SOLN
40.0000 mg | Freq: Two times a day (BID) | INTRAMUSCULAR | Status: DC
  Administered 2013-03-03 – 2013-03-04 (×4): 40 mg via INTRAVENOUS
  Filled 2013-03-03 (×4): qty 4

## 2013-03-03 MED ORDER — VANCOMYCIN HCL 10 G IV SOLR: 1250.0000 mg | INTRAVENOUS | Status: DC

## 2013-03-03 MED ORDER — VANCOMYCIN HCL IN DEXTROSE 1-5 GM/200ML-% IV SOLN
INTRAVENOUS | Status: AC
  Filled 2013-03-03: qty 200

## 2013-03-03 MED ORDER — PIPERACILLIN-TAZOBACTAM 3.375 G IVPB
3.3750 g | Freq: Three times a day (TID) | INTRAVENOUS | Status: DC
  Administered 2013-03-03 – 2013-03-05 (×5): 3.375 g via INTRAVENOUS
  Filled 2013-03-03 (×6): qty 50

## 2013-03-03 MED ORDER — VANCOMYCIN HCL IN DEXTROSE 1-5 GM/200ML-% IV SOLN
1000.0000 mg | Freq: Once | INTRAVENOUS | Status: AC
  Administered 2013-03-03: 1000 mg via INTRAVENOUS
  Filled 2013-03-03: qty 200

## 2013-03-03 NOTE — Clinical Social Work Note (Signed)
Patient informed that [facility] is authorized by [facility] for SNF, left message asking son to call.  [facility] admissions aware that MD estimates discharge in 24 - 48 hours.  Santa Genera, LCSW Clinical Social Worker [phone])

## 2013-03-03 NOTE — Significant Event (Signed)
Called by RN re: high fever. Pt on BiPAP and unable to take PO acetaminophen. I reviewed medications and noted that ceftriaxone was started earlier today. Subsequently, Vanc and Zosyn have been ordered. Pt has been cultured today.  Will D/C ceftriaxone  Acetaminophen supp ordered   Billy Fischer, MD ; [facility] [phone].  After 5:30 PM or weekends, call [phone]

## 2013-03-03 NOTE — Clinical Social Work Note (Signed)
Jeanine from [facility] called, SNF bed authorized for patient at [facility].  Tresa Endo from [facility] confirmed that she has spoken [employer] and they are willing to take patient when discharged.  [employer] Medicare gave EMS auth ([id number]).  MD informed of bed availability.  Santa Genera, LCSW Clinical Social Worker [phone])

## 2013-03-03 NOTE — Progress Notes (Signed)
Subjective: Denies abdominal pain, N/V. Believes she may have just had a bowel movement in the bed. No significant GI complaints. No overt GI bleeding.   Objective: Vital signs in last 24 hours: Temp:  [98.1 F (36.7 C)-100.8 F (38.2 C)] 98.1 F (36.7 C) (12/22 0630) Pulse Rate:  [92-98] 92 (12/22 0630) Resp:  [20-22] 20 (12/22 0630) BP: (116-119)/(49-51) 119/49 mmHg (12/22 0630) SpO2:  [94 %-96 %] 95 % (12/22 0715) Last BM Date: 03/03/13 General:   Alert and oriented, pleasant Head:  Normocephalic and atraumatic. Heart:  S1, S2 present.  Lungs: Scattered rhonchi, coarse, diminished bases  Abdomen:  Bowel sounds present, soft, non-tender, non-distended. Obese, difficult to appreciate HSM due to large AP diameter.  Neurologic:  Alert and  oriented x4;  grossly normal neurologically.   Intake/Output from previous day: 12/21 0701 - 12/22 0700 In: 440 [P.O.:440] Out: 1150 [Urine:1150] Intake/Output this shift: Total I/O In: 240 [P.O.:240] Out: -    Studies/Results: Ct Head Wo Contrast  03/02/2013   CLINICAL DATA:  Increasing confusion, altered mental status.  EXAM: CT HEAD WITHOUT CONTRAST  TECHNIQUE: Contiguous axial images were obtained from the base of the skull through the vertex without intravenous contrast.  COMPARISON:  03/21/2010  FINDINGS: Mild chronic microvascular changes in the deep white matter, most pronounced in the right frontal lobe. This has progressed since 2012. No acute hemorrhage or hydrocephalus. No acute infarction or extra-axial fluid collection. No mass lesion or midline shift.  No acute calvarial abnormality. 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  IMPRESSION: No acute intracranial abnormality.   Electronically Signed   By: Charlett Nose M.D.   On: 03/02/2013 09:14    Assessment: 72 year old female with numerous medical issues who was found to have profound anemia; heme negative and continues to have no signs of overt GI bleeding. EGD with mild erosive  reflux esophagitis. Hgb stable. If further diarrhea, Cdiff and stool cultures have been ordered.    Plan: Will follow peripherally Outpatient colonoscopy Stool studies if recurrent diarrhea  Nira Retort, ANP-BC [facility]    LOS: 7 days    03/03/2013, 12:48 PM

## 2013-03-03 NOTE — Progress Notes (Signed)
Patient transferred to ICU-5. Bedside report given to Nadine Counts, [place].  Verbalized understanding. Schonewitz, Candelaria Stagers 03/03/2013

## 2013-03-03 NOTE — Progress Notes (Signed)
  Jill Robinson WUJ:[id number] DOB: 06/20/1940 DOA: 02/19/2013 PCP: Milana Obey, MD   Subjective: This lady feels dyspneic at rest. Her chest x-ray done 3 days ago shows congestive heart failure. She may have been a little fluid overload. She has also developed a rash since starting ciprofloxacin for UTI.           Physical Exam: Blood pressure 119/49, pulse 92, temperature 98.1 F (36.7 C), temperature source Oral, resp. rate 20, height 5\' 3"  (1.6 m), weight 112.038 kg (247 lb), SpO2 95.00%. She looks miserable. Lung fields show bilateral wheezing with inspiratory crackles in the mid and lower zones. Continues to her peripheral edema. She has a maculopapular rash on her face and trunk.         Ct Head Wo Contrast  03/02/2013   CLINICAL DATA:  Increasing confusion, altered mental status.  EXAM: CT HEAD WITHOUT CONTRAST  TECHNIQUE: Contiguous axial images were obtained from the base of the skull through the vertex without intravenous contrast.  COMPARISON:  03/21/2010  FINDINGS: Mild chronic microvascular changes in the deep white matter, most pronounced in the right frontal lobe. This has progressed since 2012. No acute hemorrhage or hydrocephalus. No acute infarction or extra-axial fluid collection. No mass lesion or midline shift.  No acute calvarial abnormality. 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  IMPRESSION: No acute intracranial abnormality.   Electronically Signed   By: Charlett Nose M.D.   On: 03/02/2013 09:14      Medications: I have reviewed the patient's current medications.  Impression: 1. Severe anemia secondary to methotrexate toxicity. Status post 6 units of blood transfusion. No evidence of GI bleed on endoscopy. 2. Severe psoriasis, was in remission with methotrexate. The vertex that has been discontinued. 3. Type 2 diabetes mellitus. 4. Congestive heart failure, acute on chronic. 5. Yeast infection in the intertriginous zones. On  Diflucan.     Plan: 1. Start intravenous Lasix 40 mg twice a day. 2. Discontinue ciprofloxacin. Start Rocephin IV for presumed UTI.  Consultants:  None.   Procedures:  None.                      Code Status: Full code.  Family Communication: Discussed plan with patient at the bedside.   Disposition Plan: She will likely need skilled nursing facility for short-term rehabilitation when she is medically stable. However, her insurance will make this very difficult to achieve in a acceptable timeframe.  Time spent: 20 minutes.   LOS: 7 days   Wilson Singer Pager [phone]  03/03/2013, 8:45 AM

## 2013-03-03 NOTE — Progress Notes (Signed)
Called results of chest xray and abg to MD.  Orders received to transfer patient to ICU.  Bed requested and patient/family notified. Schonewitz, Candelaria Stagers 03/03/2013

## 2013-03-03 NOTE — Progress Notes (Signed)
Found patient lying flat in the bed, wheezing, short of breath at rest.  Pulled patient up in bed, called respiratory to administer breathing treatment.  IV lasix given per previous MD order.  MD notified.  New orders received to give an additional dose of Lasix, stat ABG, stat portable CXR, stat BNP/CBC/BMET.  Orders received and carried out.  Will continue to support and monitor patient.  Family at bedside and informed of patient's condition.  Schonewitz, Candelaria Stagers 03/03/2013

## 2013-03-04 ENCOUNTER — Encounter: Payer: Self-pay | Admitting: Internal Medicine

## 2013-03-04 ENCOUNTER — Encounter (HOSPITAL_COMMUNITY): Payer: Medicare Other

## 2013-03-04 ENCOUNTER — Inpatient Hospital Stay (HOSPITAL_COMMUNITY): Payer: Medicare Other

## 2013-03-04 LAB — BASIC METABOLIC PANEL
BUN: 60 mg/dL — ABNORMAL HIGH (ref 6–23)
Calcium: 9.1 mg/dL (ref 8.4–10.5)
Creatinine, Ser: 2.48 mg/dL — ABNORMAL HIGH (ref 0.50–1.10)
GFR calc Af Amer: 22 mL/min — ABNORMAL LOW (ref >90)
GFR calc non Af Amer: 19 mL/min — ABNORMAL LOW (ref >90)
Glucose, Bld: 106 mg/dL — ABNORMAL HIGH (ref 70–99)
Potassium: 5.9 mEq/L — ABNORMAL HIGH (ref 3.5–5.1)

## 2013-03-04 LAB — CBC WITH DIFFERENTIAL/PLATELET
Band Neutrophils: 43 % — ABNORMAL HIGH (ref 0–10)
Basophils Absolute: 0 10*3/uL (ref 0.0–0.1)
Basophils Relative: 0 % (ref 0–1)
Eosinophils Relative: 0 % (ref 0–5)
Hemoglobin: 11.8 g/dL — ABNORMAL LOW (ref 12.0–15.0)
Lymphocytes Relative: 5 % — ABNORMAL LOW (ref 12–46)
Lymphs Abs: 1.4 10*3/uL (ref 0.7–4.0)
MCH: 29.3 pg (ref 26.0–34.0)
MCV: 96 fL (ref 78.0–100.0)
Monocytes Absolute: 1.9 10*3/uL — ABNORMAL HIGH (ref 0.1–1.0)
Monocytes Relative: 7 % (ref 3–12)
Neutro Abs: 23.8 10*3/uL — ABNORMAL HIGH (ref 1.7–7.7)
Platelets: 719 10*3/uL — ABNORMAL HIGH (ref 150–400)
Promyelocytes Absolute: 2 %
RDW: 16.6 % — ABNORMAL HIGH (ref 11.5–15.5)
nRBC: 0 /100 WBC

## 2013-03-04 LAB — URINE CULTURE: Colony Count: >=100000

## 2013-03-04 LAB — POTASSIUM: Potassium: 5.1 mEq/L (ref 3.5–5.1)

## 2013-03-04 MED ORDER — VANCOMYCIN HCL 10 G IV SOLR
1250.0000 mg | INTRAVENOUS | Status: DC
  Administered 2013-03-04: 1250 mg via INTRAVENOUS
  Filled 2013-03-04 (×3): qty 1250

## 2013-03-04 MED ORDER — BIOTENE DRY MOUTH MT LIQD
15.0000 mL | Freq: Two times a day (BID) | OROMUCOSAL | Status: DC
  Administered 2013-03-05 (×2): 15 mL via OROMUCOSAL

## 2013-03-04 MED ORDER — SODIUM CHLORIDE 0.9 % IJ SOLN
10.0000 mL | Freq: Two times a day (BID) | INTRAMUSCULAR | Status: DC
  Administered 2013-03-04 (×3): 10 mL

## 2013-03-04 MED ORDER — HYDROMORPHONE HCL PF 1 MG/ML IJ SOLN
1.0000 mg | INTRAMUSCULAR | Status: DC | PRN
  Administered 2013-03-04 – 2013-03-05 (×4): 1 mg via INTRAVENOUS
  Filled 2013-03-04 (×4): qty 1

## 2013-03-04 MED ORDER — SODIUM CHLORIDE 0.9 % IJ SOLN
10.0000 mL | INTRAMUSCULAR | Status: DC | PRN
  Administered 2013-03-04 (×4): 10 mL

## 2013-03-04 MED ORDER — SODIUM CHLORIDE 0.9 % IV SOLN
INTRAVENOUS | Status: DC
  Administered 2013-03-04: 1000 mL via INTRAVENOUS
  Administered 2013-03-04: 17:00:00 via INTRAVENOUS

## 2013-03-04 MED ORDER — CHLORHEXIDINE GLUCONATE 0.12 % MT SOLN
15.0000 mL | Freq: Two times a day (BID) | OROMUCOSAL | Status: DC
  Administered 2013-03-04 – 2013-03-05 (×2): 15 mL via OROMUCOSAL
  Filled 2013-03-04 (×2): qty 15

## 2013-03-04 MED ORDER — DOBUTAMINE IN D5W 4-5 MG/ML-% IV SOLN
2.5000 ug/kg/min | INTRAVENOUS | Status: DC
  Administered 2013-03-04: 2.5 ug/kg/min via INTRAVENOUS
  Filled 2013-03-04: qty 250

## 2013-03-04 NOTE — Progress Notes (Signed)
Patient stuck X2 by 2 therapist with no success. RN notified

## 2013-03-04 NOTE — Progress Notes (Signed)
UR chart review completed.  

## 2013-03-04 NOTE — Clinical Social Work Note (Signed)
CSW met w grandson and granddaughter in room, rest of family asleep in family room in ICU.  Family informed that SNF rehab request may need to be resubmitted to [facility], family also advised about application process for Medicaid, information on process given.    Santa Genera, LCSW Clinical Social Worker [phone])

## 2013-03-04 NOTE — Progress Notes (Signed)
PT Cancellation Note  Patient Details Name: Rashika Bettes MRN: [id number] DOB: 1940/08/09   Cancelled Treatment:     Per nurse pt is not medically stable will hold PT   Yitzel Shasteen,CINDY 03/04/2013, 9:14 AM

## 2013-03-04 NOTE — Clinical Social Work Note (Signed)
[facility] admissions informed that patient is now in ICU, SNF staff will call [facility] and seeing if they can put hold on SNF auth or whether it will need to be resubmitted.  Santa Genera, LCSW Clinical Social Worker [phone])

## 2013-03-04 NOTE — Progress Notes (Signed)
ANTIBIOTIC CONSULT NOTE - INITIAL  Pharmacy Consult for Vancomycin and Zosyn Indication: fever and suspected pneumonia  Allergies  Allergen Reactions  . Aspirin     REACTION: GI upset   Patient Measurements: Height: 5\' 3"  (160 cm) Weight: 246 lb 0.5 oz (111.6 kg) IBW/kg (Calculated) : 52.4  Vital Signs: Temp: 101.8 F (38.8 C) (12/23 0400) Temp src: Axillary (12/23 0156) BP: 107/41 mmHg (12/23 0600) Pulse Rate: 115 (12/23 0600) Intake/Output from previous day: 12/22 0701 - 12/23 0700 In: 540 [P.O.:240; IV Piggyback:300] Out: 400 [Urine:400] Intake/Output from this shift:    Labs:  Recent Labs  03/03/13 1833 03/04/13 0449  WBC 12.7* 27.1*  HGB 11.8* 11.8*  PLT 642* 719*  CREATININE 1.55* 2.48*   Estimated Creatinine Clearance: 25.4 ml/min (by C-G formula based on Cr of 2.48). No results found for this basename: VANCOTROUGH, Leodis Binet, VANCORANDOM, GENTTROUGH, GENTPEAK, GENTRANDOM, TOBRATROUGH, TOBRAPEAK, TOBRARND, AMIKACINPEAK, AMIKACINTROU, AMIKACIN,  in the last 72 hours   Microbiology: Recent Results (from the past 720 hour(s))  URINE CULTURE     Status: None   Collection Time    03/01/13 11:44 PM      Result Value Range Status   Specimen Description URINE, CATHETERIZED   Final   Special Requests NONE   Final   Culture  Setup Time     Final   Value: 03/02/2013 23:35     Performed at [facility] Count     Final   Value: >=100,000 COLONIES/ML     Performed at [facility]   Culture     Final   Value: ESCHERICHIA COLI     Performed at [facility]   Report Status PENDING   Incomplete  CULTURE, BLOOD (ROUTINE X 2)     Status: None   Collection Time    03/03/13  8:41 PM      Result Value Range Status   Specimen Description BLOOD RIGHT ANTECUBITAL   Final   Special Requests     Final   Value: BOTTLES DRAWN AEROBIC AND ANAEROBIC AEB 8CC ANA 4CC   Culture PENDING   Incomplete   Report Status PENDING   Incomplete   CULTURE, BLOOD (ROUTINE X 2)     Status: None   Collection Time    03/03/13  8:41 PM      Result Value Range Status   Specimen Description BLOOD LEFT HAND   Final   Special Requests BOTTLES DRAWN AEROBIC AND ANAEROBIC 4CC EACH   Final   Culture PENDING   Incomplete   Report Status PENDING   Incomplete   Medical History: Past Medical History  Diagnosis Date  . Arthritis   . Joint pain   . Stroke   . Ulcer   . COPD (chronic obstructive pulmonary disease)   . Diabetes mellitus   . Carotid artery occlusion   . Hypertension   . Psoriasis   . DVT (deep venous thrombosis)   . Congestive heart failure   . Venous insufficiency     Medications:  Scheduled:  . albuterol  2.5 mg Nebulization TID  . clopidogrel  75 mg Oral Q breakfast  . feeding supplement (ENSURE COMPLETE)  237 mL Oral BID BM  . feeding supplement (PRO-STAT SUGAR FREE 64)  30 mL Oral TID WC  . fluconazole  100 mg Oral Daily  . folic acid  1 mg Oral Daily  . furosemide  40 mg Intravenous BID  . ipratropium  500 mcg  Nebulization TID  . mometasone-formoterol  2 puff Inhalation BID  . pantoprazole  40 mg Oral BID AC  . piperacillin-tazobactam (ZOSYN)  IV  3.375 g Intravenous Q8H  . potassium chloride SA  20 mEq Oral Daily  . vancomycin  1,250 mg Intravenous Q24H  . vancomycin  1,000 mg Intravenous Once   Assessment: 72yo female in ICU with high fever and suspected pneumonia.  Vancomycin and Zosyn initiated last pm.  Pt has worsening renal fxn  SCr 1.5 >> 2.4 today.  Estimated Creatinine Clearance: 25.4 ml/min (by C-G formula based on Cr of 2.48).  Goal of Therapy:  Vancomycin trough level 15-20 mcg/ml Eradicate infection.  Plan:  Vancomycin 1250mg  IV q24hrs starting this am Check trough at steady state as indicated Zosyn 3.375gm IV q8h, each dose over 4 hrs Monitor SCr daily for now, adjust dosing as needed F/U cultures as available  Valrie Hart A 03/04/2013,7:47 AM

## 2013-03-04 NOTE — Progress Notes (Signed)
PT'S FOLEY IS DRAINING SCANT AMOUNT OF YELLOW URINE W/ WHITE SEDIMENT. CATHETER IRRIGATED W/ 120CC STERILE NS, W/ GOOD RETURN. PT TOLERATED PROCEDURE WELL.

## 2013-03-04 NOTE — Progress Notes (Signed)
RN spoke w/ family concerning dnr order. Family states that pt spoke w/ her brother at length yesterday 03/03/13 concerning  her desire not to be coded or intubated. This is also the family's wish.

## 2013-03-04 NOTE — Consult Note (Signed)
Randa Riss MRN: [id number] DOB/AGE: 70-Mar-1942 70 y.o. Primary Care Physician:KNOWLTON,STEPHEN D, MD Admit date: 03/07/2013 Chief Complaint:  Chief Complaint  Patient presents with  . Skin Ulcer   History from medical records and pt's son./  HPI: Pt is 72 year female with past medical hx of DM who presented to ER with c/o Weakness.  HPI dates back to 02/15/2013 when pt presented to Er with c/o weakness.Pt was found to have severe anemi and was admitted for further care. Pt did receive PRBC.Pt also had upper Gi work up done the patient is to have lower GI scope done as outpt. In the interim pt became more confused, hypotensive and febrile. Pt creat was noted to be high and nephrology was consulted. Pt is confused and does not offers any specific complaints.   Past Medical History  Diagnosis Date  . Arthritis   . Joint pain   . Stroke   . Ulcer   . COPD (chronic obstructive pulmonary disease)   . Diabetes mellitus   . Carotid artery occlusion   . Hypertension   . Psoriasis   . DVT (deep venous thrombosis)   . Congestive heart failure   . Venous insufficiency       Family History  Problem Relation Age of Onset  . Colon cancer Neg Hx    NO hx of ESRD .  Social History:  reports that she has been smoking Cigarettes.  She has been smoking about 0.50 packs per day. She quit smokeless tobacco use about 2 months ago. She reports that she does not drink alcohol or use illicit drugs.   Allergies:  Allergies  Allergen Reactions  . Aspirin     REACTION: GI upset    Medications Prior to Admission  Medication Sig Dispense Refill  . albuterol (PROVENTIL HFA;VENTOLIN HFA) 108 (90 BASE) MCG/ACT inhaler Inhale 2 puffs into the lungs every 6 (six) hours as needed for wheezing.      Marland Kitchen albuterol (PROVENTIL) (2.5 MG/3ML) 0.083% nebulizer solution Take 2.5 mg by nebulization every 6 (six) hours as needed for wheezing or shortness of breath.      . barrier cream (NON-SPECIFIED)  CREA Apply 1 application topically 2 (two) times daily as needed (psoriasis).      . clopidogrel (PLAVIX) 75 MG tablet Take 75 mg by mouth daily.        . diphenhydrAMINE (BENADRYL) 25 MG tablet Take 25 mg by mouth every 6 (six) hours as needed for itching.      . ferrous sulfate 325 (65 FE) MG tablet Take 325 mg by mouth daily.      . Fluticasone-Salmeterol (ADVAIR) 250-50 MCG/DOSE AEPB Inhale 1 puff into the lungs every 12 (twelve) hours.      . folic acid (FOLVITE) 1 MG tablet Take 1 mg by mouth daily.      . furosemide (LASIX) 40 MG tablet Take 40 mg by mouth daily.        Marland Kitchen GARLIC PO Take 1 tablet by mouth daily.      Marland Kitchen ipratropium (ATROVENT) 0.02 % nebulizer solution Take 500 mcg by nebulization 4 (four) times daily.       . methotrexate (RHEUMATREX) 2.5 MG tablet Take 20 mg by mouth once a week. On Wednesday. Caution:Chemotherapy. Protect from light.      . Omega-3 Fatty Acids (FISH OIL PO) Take 1 capsule by mouth daily.      . potassium chloride SA (K-DUR,KLOR-CON) 20 MEQ tablet Take 20 mEq  by mouth daily.           OZH:YQMVHQ to assess   . albuterol  2.5 mg Nebulization TID  . clopidogrel  75 mg Oral Q breakfast  . feeding supplement (ENSURE COMPLETE)  237 mL Oral BID BM  . feeding supplement (PRO-STAT SUGAR FREE 64)  30 mL Oral TID WC  . fluconazole  100 mg Oral Daily  . folic acid  1 mg Oral Daily  . furosemide  40 mg Intravenous BID  . ipratropium  500 mcg Nebulization TID  . mometasone-formoterol  2 puff Inhalation BID  . pantoprazole  40 mg Oral BID AC  . piperacillin-tazobactam (ZOSYN)  IV  3.375 g Intravenous Q8H  . sodium chloride  10-40 mL Intracatheter Q12H  . vancomycin  1,250 mg Intravenous Q24H  . vancomycin  1,000 mg Intravenous Once        Physical Exam: Vital signs in last 24 hours: Temp:  [98.6 F (37 C)-103.6 F (39.8 C)] 100.9 F (38.3 C) (12/23 1330) Pulse Rate:  [44-126] 102 (12/23 1400) Resp:  [20-47] 26 (12/23 1400) BP: (54-126)/(28-107)  92/36 mmHg (12/23 1400) SpO2:  [83 %-100 %] 97 % (12/23 1402) FiO2 (%):  [50 %-70 %] 70 % (12/23 1402) Weight:  [246 lb 0.5 oz (111.6 kg)] 246 lb 0.5 oz (111.6 kg) (12/23 0500) Weight change:  Last BM Date: 03/03/13  Intake/Output from previous day: 12/22 0701 - 12/23 0700 In: 540 [P.O.:240; IV Piggyback:300] Out: 400 [Urine:400] Total I/O In: 500 [P.O.:240; I.V.:10; IV Piggyback:250] Out: -    Physical Exam: General- pt is confused. Resp- Bipap in situ, decreased BS at bases. CVS- S1S2 regular in rate and rhythm, distant GIT- BS+, soft, NT, ND,Morbidly obese EXT- trace LE Edema,Erythma + Psych-unable to assess   Lab Results: CBC  Recent Labs  03/03/13 1833 03/04/13 0449  WBC 12.7* 27.1*  HGB 11.8* 11.8*  HCT 38.5 38.7  PLT 642* 719*    BMET  Recent Labs  03/03/13 1833 03/04/13 0449  NA 137 138  K 5.0 5.9*  CL 93* 93*  CO2 31 27  GLUCOSE 122* 106*  BUN 51* 60*  CREATININE 1.55* 2.48*  CALCIUM 9.0 9.1   Creat Trend 2014   1.62==>1.26==>1.55==>2.48           1.2--1.4 ( baseline)  MICRO Recent Results (from the past 240 hour(s))  URINE CULTURE     Status: None   Collection Time    03/01/13 11:44 PM      Result Value Range Status   Specimen Description URINE, CATHETERIZED   Final   Special Requests NONE   Final   Culture  Setup Time     Final   Value: 03/02/2013 23:35     Performed at [facility] Count     Final   Value: >=100,000 COLONIES/ML     Performed at [facility]   Culture     Final   Value: ESCHERICHIA COLI     Performed at [facility]   Report Status PENDING   Incomplete  CULTURE, BLOOD (ROUTINE X 2)     Status: None   Collection Time    03/03/13  8:41 PM      Result Value Range Status   Specimen Description BLOOD RIGHT ANTECUBITAL   Final   Special Requests     Final   Value: BOTTLES DRAWN AEROBIC AND ANAEROBIC AEB 8CC ANA 4CC   Culture NO GROWTH 1  DAY   Final   Report Status PENDING    Incomplete  CULTURE, BLOOD (ROUTINE X 2)     Status: None   Collection Time    03/03/13  8:41 PM      Result Value Range Status   Specimen Description BLOOD LEFT HAND   Final   Special Requests BOTTLES DRAWN AEROBIC AND ANAEROBIC 4CC EACH   Final   Culture NO GROWTH 1 DAY   Final   Report Status PENDING   Incomplete      Lab Results  Component Value Date   CALCIUM 9.1 03/04/2013   CAION 1.11* 06/15/2010   PHOS 3.5 06/15/2009      Impression: 1)Renal  AKI secondary to ATN                Oliguric ATN                ATN sec to sepsis                   High WBC + Positive Urine cultue + Fever + Low BP               CKD stage 3 .               CKD since 2014               CKD secondary to HTN                Progression of CKD                 Proteinura will check.  2)CVS- Hypotensive  3)Anemia HGb at goal (9--11)   4)ID- sepsis On vanco + Zosyn  5)Resp- hx pf COPD Primary MD following  6)Hyperkalemia AKI  On CKD and  PO Kcl KCL now d/ced  7)Acid base Co2 at goal     Plan:  Will suggest to start IVF ns at 100 ml/hr. Will ask for renal US. Will ask for FENA. Will follow for Potassium at 5-6 pm and if K still high will give kayexalate. Educated pt family renal disease/issues.     BHUTANI,MANPREET S 03/04/2013, 2:33 PM

## 2013-03-05 LAB — BASIC METABOLIC PANEL
Chloride: 98 mEq/L (ref 96–112)
GFR calc Af Amer: 13 mL/min — ABNORMAL LOW (ref >90)
Potassium: 6.4 mEq/L (ref 3.5–5.1)
Sodium: 141 mEq/L (ref 135–145)

## 2013-03-05 MED ORDER — PHENYLEPHRINE HCL 10 MG/ML IJ SOLN
30.0000 ug/min | INTRAVENOUS | Status: DC
  Filled 2013-03-05: qty 1

## 2013-03-05 MED ORDER — ACETAMINOPHEN 650 MG RE SUPP
650.0000 mg | RECTAL | Status: DC | PRN
  Filled 2013-03-05: qty 1

## 2013-03-05 MED ORDER — SODIUM CHLORIDE 0.9 % IV SOLN
1.0000 mg/h | INTRAVENOUS | Status: DC
  Administered 2013-03-05: 3 mg/h via INTRAVENOUS
  Administered 2013-03-05: 1 mg/h via INTRAVENOUS
  Filled 2013-03-05: qty 10

## 2013-03-05 MED ORDER — HYDROCORTISONE SOD SUCCINATE 100 MG IJ SOLR: 50.0000 mg | Freq: Four times a day (QID) | INTRAMUSCULAR | Status: DC

## 2013-03-05 MED ORDER — HYDROMORPHONE HCL PF 1 MG/ML IJ SOLN
1.0000 mg | INTRAMUSCULAR | Status: DC | PRN
  Administered 2013-03-05 (×2): 1 mg via INTRAVENOUS
  Filled 2013-03-05 (×2): qty 1

## 2013-03-07 LAB — WOUND CULTURE

## 2013-03-08 LAB — CULTURE, BLOOD (ROUTINE X 2): Culture: NO GROWTH

## 2013-03-13 NOTE — Progress Notes (Signed)
NAMEMARISA, HAGE NO.:  [id number]  MEDICAL RECORD NO.:  [id number]  LOCATION:  IC05                          FACILITY:  [facility]  PHYSICIAN:  Mila Homer. Sudie Bailey, M.D.DATE OF BIRTH:  08-Oct-1940  DATE OF PROCEDURE: DATE OF DISCHARGE:                                PROGRESS NOTE   SUBJECTIVE:  This 73 year old took a turn for the worse and was transferred to the intensive care unit at [facility].  She developed pulmonary edema.  Three family members are with her now in the room.  She really is not able to say much, on a Ventimask.  OBJECTIVE:  GENERAL:  She is semi-recumbent in bed. VITAL SIGNS:  Temperature is 99.8, pulse 114, respiratory rate 41, and her blood pressure 106/51, her low has been 95/36.  On my exam, her heart rate was 110 and respiratory rate about 50. LUNGS:  She has decreased breath sounds of her lungs but she is moving air well. HEART:  Regular rhythm.  Again, rate about 110. SKIN:  She has a diffuse erythematous rash that is confluent.  She has 2 areas of skin breakdown on the left anterior lower leg, 1 measuring about 4 inches x 2 inches in an elliptical pattern, the other measuring about 1.5 inch in a nummular pattern.  There was a malodorous quality to these.  LABORATORY AND DIAGNOSTIC DATA:  Her white cell count today was elevated to 27,100, up from 12,700 yesterday.  There are 25 neutrophils, 43 bands, 2 metamyelocytes, 16 myelocytes.  Her BUN was 60, creatinine 2.48 up from 51 and 1.55 yesterday.  Chest x-ray showed pleural effusions.  Urine is growing out greater 100,000 E. coli, but the sensitivities are pending.  Blood cultures x2 are negative.  She did have a CT of her head a couple days ago.  She has chronic microvascular changes, more pronounced in the right frontal lobe, but no acute problems.  Her EGD on December 17 was essentially normal.  ASSESSMENT: 1. Escherichia coli urinary tract infection. 2. Probable  sepsis. 3. Psoriasis. 4. Recent bone marrow depressive effects of methotrexate, which has     now been stopped. 5. Worsening anemia secondary to methotrexate toxicity. 6. Type 2 diabetes, currently stable with sugars in the low 100 range. 7. Decreased urine output, possibly secondary to hypotension from     sepsis.  PLAN:  She is going to continue with IV fluids.  A PICC line has been placed.  Nephrology has been consulted.  I reviewed her medications. She currently is on piperacillin/tazobactam and vancomycin IV, and may need some IV Dilaudid for pain control.     Mila Homer. Sudie Bailey, M.D.     SDK/MEDQ  D:  03/04/2013  T:  03/04/2013  Job:  [id number]

## 2013-03-13 NOTE — Consult Note (Signed)
Consult requested by: Dr. Karilyn Cota Consult requested for respiratory failure:  HPI: This is a 73 year old Caucasian female who came to the hospital with profound anemia thought to be due to bone marrow suppression from methotrexate. She is on methotrexate for psoriasis and apparently has some psoriatic arthritis. She received blood transfusions and her hemoglobin improved but on the night of the 22nd she developed increasing shortness of breath and appeared to have a combination of volume overload and pneumonia. She was transferred to the intensive care unit started on antibiotics given Lasix but has continued to get worse. Her renal function has deteriorated in she's been seen by nephrology. Her potassium this morning is 6.4. She has had to be started on pressors. She's on BiPAP and requiring 70% oxygen.  Past Medical History  Diagnosis Date  . Arthritis   . Joint pain   . Stroke   . Ulcer   . COPD (chronic obstructive pulmonary disease)   . Diabetes mellitus   . Carotid artery occlusion   . Hypertension   . Psoriasis   . DVT (deep venous thrombosis)   . Congestive heart failure   . Venous insufficiency      Family History  Problem Relation Age of Onset  . Colon cancer Neg Hx      History   Social History  . Marital Status: Widowed    Spouse Name: N/A    Number of Children: N/A  . Years of Education: N/A   Social History Main Topics  . Smoking status: Current Every Day Smoker -- 0.50 packs/day    Types: Cigarettes  . Smokeless tobacco: Former Neurosurgeon    Quit date: 12/30/2012     Comment: pt states that she is trying to quit  . Alcohol Use: No  . Drug Use: No  . Sexual Activity: None   Other Topics Concern  . None   Social History Narrative  . None     ROS: Not obtainable    Objective: Vital signs in last 24 hours: Temp:  [99.8 F (37.7 C)-102.6 F (39.2 C)] 100.2 F (37.9 C) (12/24 0345) Pulse Rate:  [37-124] 37 (12/24 0730) Resp:  [17-36] 23 (12/24  0730) BP: (57-109)/(24-60) 79/46 mmHg (12/24 0730) SpO2:  [94 %-98 %] 98 % (12/24 0733) FiO2 (%):  [70 %] 70 % (12/24 0733) Weight change:  Last BM Date: 03/03/13  Intake/Output from previous day: 12/23 0701 - 12/24 0700 In: 2375.9 [P.O.:360; I.V.:1545.9; IV Piggyback:350] Out: 155 [Urine:155]  PHYSICAL EXAM She is an obese female with BiPAP mask in place. She has diffuse erythema of her skin. She is poorly responsive. Her neck is supple. Her chest shows rhonchi bilaterally. Her heart is regular with tachycardia. Her abdomen is soft obese without masses. She has skin lesions on her legs with some open wounds. She does move all 4 extremities.  Lab Results: Basic Metabolic Panel:  Recent Labs  16/10/96 0449 03/04/13 1647 03/04/2013 0436  NA 138  --  141  K 5.9* 5.1 6.4*  CL 93*  --  98  CO2 27  --  29  GLUCOSE 106*  --  83  BUN 60*  --  78*  CREATININE 2.48*  --  3.84*  CALCIUM 9.1  --  8.4   Liver Function Tests: No results found for this basename: AST, ALT, ALKPHOS, BILITOT, PROT, ALBUMIN,  in the last 72 hours No results found for this basename: LIPASE, AMYLASE,  in the last 72 hours No results found for  this basename: AMMONIA,  in the last 72 hours CBC:  Recent Labs  03/03/13 1833 03/04/13 0449  WBC 12.7* 27.1*  NEUTROABS  --  23.8*  HGB 11.8* 11.8*  HCT 38.5 38.7  MCV 95.8 96.0  PLT 642* 719*   Cardiac Enzymes: No results found for this basename: CKTOTAL, CKMB, CKMBINDEX, TROPONINI,  in the last 72 hours BNP:  Recent Labs  03/03/13 1833  PROBNP 2744.0*   D-Dimer: No results found for this basename: DDIMER,  in the last 72 hours CBG: No results found for this basename: GLUCAP,  in the last 72 hours Hemoglobin A1C: No results found for this basename: HGBA1C,  in the last 72 hours Fasting Lipid Panel: No results found for this basename: CHOL, HDL, LDLCALC, TRIG, CHOLHDL, LDLDIRECT,  in the last 72 hours Thyroid Function Tests: No results found for this  basename: TSH, T4TOTAL, FREET4, T3FREE, THYROIDAB,  in the last 72 hours Anemia Panel: No results found for this basename: VITAMINB12, FOLATE, FERRITIN, TIBC, IRON, RETICCTPCT,  in the last 72 hours Coagulation: No results found for this basename: LABPROT, INR,  in the last 72 hours Urine Drug Screen: Drugs of Abuse  No results found for this basename: labopia, cocainscrnur, labbenz, amphetmu, thcu, labbarb    Alcohol Level: No results found for this basename: ETH,  in the last 72 hours Urinalysis: No results found for this basename: COLORURINE, APPERANCEUR, LABSPEC, PHURINE, GLUCOSEU, HGBUR, BILIRUBINUR, KETONESUR, PROTEINUR, UROBILINOGEN, NITRITE, LEUKOCYTESUR,  in the last 72 hours Misc. Labs   ABGS:  Recent Labs  03/03/13 1825  PHART 7.435  PO2ART 52.5*  TCO2 29.1  HCO3 32.4*     MICROBIOLOGY: Recent Results (from the past 240 hour(s))  URINE CULTURE     Status: None   Collection Time    03/01/13 11:44 PM      Result Value Range Status   Specimen Description URINE, CATHETERIZED   Final   Special Requests NONE   Final   Culture  Setup Time     Final   Value: 03/02/2013 23:35     Performed at [facility] Count     Final   Value: >=100,000 COLONIES/ML     Performed at [facility]   Culture     Final   Value: ESCHERICHIA COLI     Performed at [facility]   Report Status 03/04/2013 FINAL   Final   Organism ID, Bacteria ESCHERICHIA COLI   Final  CULTURE, BLOOD (ROUTINE X 2)     Status: None   Collection Time    03/03/13  8:41 PM      Result Value Range Status   Specimen Description BLOOD RIGHT ANTECUBITAL   Final   Special Requests     Final   Value: BOTTLES DRAWN AEROBIC AND ANAEROBIC AEB 8CC ANA 4CC   Culture NO GROWTH 1 DAY   Final   Report Status PENDING   Incomplete  CULTURE, BLOOD (ROUTINE X 2)     Status: None   Collection Time    03/03/13  8:41 PM      Result Value Range Status   Specimen Description BLOOD LEFT HAND    Final   Special Requests BOTTLES DRAWN AEROBIC AND ANAEROBIC 4CC EACH   Final   Culture NO GROWTH 1 DAY   Final   Report Status PENDING   Incomplete  WOUND CULTURE     Status: None   Collection Time    03/04/13  11:30 AM      Result Value Range Status   Specimen Description WOUND LEFT LOWER LEG   Final   Special Requests IMMUNE:COMPROMIED   Final   Gram Stain PENDING   Incomplete   Culture     Final   Value: Culture reincubated for better growth     Performed at [facility]   Report Status PENDING   Incomplete    Studies/Results: Dg Chest 1 View  03/03/2013   CLINICAL DATA:  Short of breath.  EXAM: CHEST - 1 VIEW  COMPARISON:  02/27/2013.  FINDINGS: Cardiomegaly. The cardiopericardial silhouette is partially obscured. There is bilateral right greater than left airspace disease and bilateral pleural effusions. Findings are compatible with moderate CHF. Underlying pneumonia cannot be excluded.  IMPRESSION: Moderate CHF.   Electronically Signed   By: Andreas Newport M.D.   On: 03/03/2013 18:39   Dg Chest Port 1 View  03/04/2013   CLINICAL DATA:  PICC line placement  EXAM: PORTABLE CHEST - 1 VIEW  COMPARISON:  Portable exam 1159 hr compared to 03/03/2013  FINDINGS: Mildly kyphotic positioning.  Right arm PICC line tip projects over the right atrium though this is likely accentuated by the degree of kyphosis.  Enlargement of cardiac silhouette with pulmonary vascular congestion.  Diffuse pulmonary infiltrates likely pulmonary edema/ CHF.  Probable right pleural effusion and basilar atelectasis as well.  No pneumothorax.  Bones demineralized.  IMPRESSION: CHF with probable right pleural effusion and right basilar atelectasis.  Tip of right arm PICC line projects over right atrium though this likely accentuated by mildly kyphotic positioning.  Recommend withdrawal of catheter 4 cm for positioning of tip at expected position of cavoatrial junction.   Electronically Signed   By: Ulyses Southward  M.D.   On: 03/04/2013 12:10    Medications:  Scheduled: . albuterol  2.5 mg Nebulization TID  . antiseptic oral rinse  15 mL Mouth Rinse q12n4p  . chlorhexidine  15 mL Mouth Rinse BID  . clopidogrel  75 mg Oral Q breakfast  . feeding supplement (ENSURE COMPLETE)  237 mL Oral BID BM  . feeding supplement (PRO-STAT SUGAR FREE 64)  30 mL Oral TID WC  . fluconazole  100 mg Oral Daily  . folic acid  1 mg Oral Daily  . furosemide  40 mg Intravenous BID  . ipratropium  500 mcg Nebulization TID  . mometasone-formoterol  2 puff Inhalation BID  . pantoprazole  40 mg Oral BID AC  . piperacillin-tazobactam (ZOSYN)  IV  3.375 g Intravenous Q8H  . sodium chloride  10-40 mL Intracatheter Q12H  . vancomycin  1,250 mg Intravenous Q24H  . vancomycin  1,000 mg Intravenous Once   Continuous: . sodium chloride 1,000 mL (03/04/13 2324)  . DOBUTamine 10 mcg/kg/min (02/26/2013 0319)   ZOX:[id number], acetaminophen, albuterol, barrier cream, diphenhydrAMINE, haloperidol lactate, HYDROcodone-acetaminophen, HYDROmorphone (DILAUDID) injection, sodium chloride  Assesment: She appears to be developing multisystem failure. Yesterday her white count was very high and she had some nucleated red blood cells in the peripheral smear. Her platelet count has increased and I suspect that's because she is coming out from her previous bone marrow suppression and responding to infection. She has Escherichia coli in her urine which is being treated. She has what looks like pulmonary edema on chest x-ray from yesterday. She has potentially some pneumonia as well. Her renal function is worse. Her potassium is 6.4 by history. She is requiring pressor support Active Problems:   Anemia  Plan: Dr. Karilyn Cota is discussing with the family about what measures they would like to undertake. They have already agreed to no CODE BLUE status I think that's appropriate. They're considering comfort care. At this time because her blood  pressure is still low I would switch her to Neo-Synephrine. I will and "stress dose" steroids    LOS: 9 days   Issam Carlyon L Mar 31, 2013, 8:00 AM

## 2013-03-13 NOTE — Progress Notes (Signed)
Lab called with critical potassium of 6.4. Dr. Sudie Bailey made aware. No orders received at this time.

## 2013-03-13 NOTE — Progress Notes (Signed)
Tona Qualley ZOX:[id number] DOB: [birth year] DOA: 02-25-13 PCP: Milana Obey, MD   Subjective: This lady has deteriorated rapidly. She is on BiPAP, requiring FiO2 of 70-80%, now is on dobutamine for hypotension and has progressive renal failure with hyperkalemia this morning.           Physical Exam: Blood pressure 79/46, pulse 37, temperature 100.2 F (37.9 C), temperature source Axillary, resp. rate 23, height 5\' 3"  (1.6 m), weight 111.6 kg (246 lb 0.5 oz), SpO2 98.00%. She looks miserable. Lung fields show bilateral wheezing with inspiratory crackles in the mid and lower zones. Continues to her peripheral edema. She has a maculopapular rash on her face and trunk. She continues on BiPAP.         Dg Chest 1 View  03/03/2013   CLINICAL DATA:  Short of breath.  EXAM: CHEST - 1 VIEW  COMPARISON:  02/27/2013.  FINDINGS: Cardiomegaly. The cardiopericardial silhouette is partially obscured. There is bilateral right greater than left airspace disease and bilateral pleural effusions. Findings are compatible with moderate CHF. Underlying pneumonia cannot be excluded.  IMPRESSION: Moderate CHF.   Electronically Signed   By: Andreas Newport M.D.   On: 03/03/2013 18:39   Dg Chest Port 1 View  03/04/2013   CLINICAL DATA:  PICC line placement  EXAM: PORTABLE CHEST - 1 VIEW  COMPARISON:  Portable exam 1159 hr compared to 03/03/2013  FINDINGS: Mildly kyphotic positioning.  Right arm PICC line tip projects over the right atrium though this is likely accentuated by the degree of kyphosis.  Enlargement of cardiac silhouette with pulmonary vascular congestion.  Diffuse pulmonary infiltrates likely pulmonary edema/ CHF.  Probable right pleural effusion and basilar atelectasis as well.  No pneumothorax.  Bones demineralized.  IMPRESSION: CHF with probable right pleural effusion and right basilar atelectasis.  Tip of right arm PICC line projects over right atrium though this likely  accentuated by mildly kyphotic positioning.  Recommend withdrawal of catheter 4 cm for positioning of tip at expected position of cavoatrial junction.   Electronically Signed   By: Ulyses Southward M.D.   On: 03/04/2013 12:10      Medications: I have reviewed the patient's current medications.  Impression: 1. Severe anemia secondary to methotrexate toxicity. Status post 6 units of blood transfusion. No evidence of GI bleed on endoscopy. 2. Severe psoriasis, was in remission with methotrexate. The vertex that has been discontinued. 3. Type 2 diabetes mellitus. 4. Acute respiratory failure secondary to combination of congestive heart failure, pneumonia and sepsis. 5. Escherichia coli UTI. 6. Acute renal failure, multifactorial but likely due to ATN from multisystem organ failure. 7. Prognosis appears to be rather poor.     Plan: 1. Discontinue IV vancomycin in view of the progressive acute renal failure. Continue with Zosyn for the time being. 2. I've had a long discussion with several family members regarding her poor condition and likely poor prognosis. They have already agreed for DO NOT RESUSCITATE and I feel that they are comfortable/in agreement with comfort measures also. I will discuss this patient with Dr. Sudie Bailey and then make further recommendations but I think comfort measures are appropriate if she does not improve soon.  3. I will asked Dr. Juanetta Gosling, pulmonology to see her today also.  Consultants:  Nephrology.  Pulmonology.   Procedures:  None.                      Code Status: DO NOT RESUSCITATE.Marland Kitchen  Family Communication:  Discussed plan with family members.  Disposition Plan: Depending on further discussions today and tomorrow.  Time spent: 20 minutes.   LOS: 9 days   Wilson Singer Pager [phone]  03-13-13, 8:27 AM Addendum: I have spoken with Dr. Sudie Bailey, her primary care physician and once again to family and everyone is in agreement for comfort  measures only. Therefore medications aimed at improving her condition will be discontinued. We will start her on morphine drip for comfort of her dyspnea and eventually discontinue BiPAP.

## 2013-03-13 NOTE — Clinical Social Work Note (Signed)
CSW reviewed chart and spoke with RN. MD had goals of care discussion with family this morning and decision was made for comfort care. Pt started on morphine drip. If needed, referral can be made to [facility].   Derenda Fennel, [place] [phone]

## 2013-03-13 NOTE — Progress Notes (Addendum)
Jill Robinson WUJ:[id number] DOB: 11-Feb-1941 DOA: 03/07/2013 PCP: Milana Obey, MD   Subjective: This lady has deteriorated rapidly. She is on BiPAP, requiring FiO2 of 70-80%, now is on dobutamine for hypotension and has progressive renal failure with hyperkalemia this morning.           Physical Exam: Blood pressure 79/46, pulse 37, temperature 100.2 F (37.9 C), temperature source Axillary, resp. rate 23, height 5\' 3"  (1.6 m), weight 111.6 kg (246 lb 0.5 oz), SpO2 98.00%. She looks miserable. Lung fields show bilateral wheezing with inspiratory crackles in the mid and lower zones. Continues to her peripheral edema. She has a maculopapular rash on her face and trunk. She continues on BiPAP.         Dg Chest 1 View  03/03/2013   CLINICAL DATA:  Short of breath.  EXAM: CHEST - 1 VIEW  COMPARISON:  02/27/2013.  FINDINGS: Cardiomegaly. The cardiopericardial silhouette is partially obscured. There is bilateral right greater than left airspace disease and bilateral pleural effusions. Findings are compatible with moderate CHF. Underlying pneumonia cannot be excluded.  IMPRESSION: Moderate CHF.   Electronically Signed   By: Andreas Newport M.D.   On: 03/03/2013 18:39   Dg Chest Port 1 View  03/04/2013   CLINICAL DATA:  PICC line placement  EXAM: PORTABLE CHEST - 1 VIEW  COMPARISON:  Portable exam 1159 hr compared to 03/03/2013  FINDINGS: Mildly kyphotic positioning.  Right arm PICC line tip projects over the right atrium though this is likely accentuated by the degree of kyphosis.  Enlargement of cardiac silhouette with pulmonary vascular congestion.  Diffuse pulmonary infiltrates likely pulmonary edema/ CHF.  Probable right pleural effusion and basilar atelectasis as well.  No pneumothorax.  Bones demineralized.  IMPRESSION: CHF with probable right pleural effusion and right basilar atelectasis.  Tip of right arm PICC line projects over right atrium though this likely  accentuated by mildly kyphotic positioning.  Recommend withdrawal of catheter 4 cm for positioning of tip at expected position of cavoatrial junction.   Electronically Signed   By: Ulyses Southward M.D.   On: 03/04/2013 12:10      Medications: I have reviewed the patient's current medications.  Impression: 1. Severe anemia secondary to methotrexate toxicity. Status post 6 units of blood transfusion. No evidence of GI bleed on endoscopy. 2. Severe psoriasis, was in remission with methotrexate. The vertex that has been discontinued. 3. Type 2 diabetes mellitus. 4. Acute respiratory failure secondary to combination of congestive heart failure, pneumonia and sepsis. 5. Escherichia coli UTI. 6. Acute renal failure, multifactorial but likely due to ATN from multisystem organ failure. 7. Prognosis appears to be rather poor.     Plan: 1. Discontinue IV vancomycin in view of the progressive acute renal failure. Continue with Zosyn for the time being. 2. I've had a long discussion with several family members regarding her poor condition and likely poor prognosis. They have already agreed for DO NOT RESUSCITATE and I feel that they are comfortable/in agreement with comfort measures also. I will discuss this patient with Dr. Sudie Bailey and then make further recommendations but I think comfort measures are appropriate if she does not improve soon.  3. I will asked Dr. Juanetta Gosling, pulmonology to see her today also.  Consultants:  Nephrology.  Pulmonology.   Procedures:  None.                      Code Status: DO NOT RESUSCITATE.Marland Kitchen  Family Communication:  Discussed plan with family members.  Disposition Plan: Depending on further discussions today and tomorrow.  Time spent: 20 minutes.   LOS: 9 days   Wilson Singer Pager [phone]  03/12/2013, 8:09 AM

## 2013-03-13 NOTE — Progress Notes (Signed)
Physical Therapy Discharge Patient Details Name: Jill Robinson MRN: [id number] DOB: 1941-02-03 Today's Date: 02/25/2013 Time:  -     Patient discharged from PT services secondary to medical decline - will need to re-order PT to resume therapy services.  Please see latest therapy progress note for current level of functioning and progress toward goals.    Progress and discharge plan discussed with patient and/or caregiver:   GP     RUSSELL,CINDY 03/07/2013, 8:13 AM

## 2013-03-13 NOTE — Progress Notes (Signed)
PT'S ENTIRE BODY IS RED AND THE SKIN IS PEELING OFF ALL OVER. ALSO HAS MULTIPLE LARGE RED RASH AREAS. SKIN BARRIER APPLIED TO BODY ESP IN FOLD AREAS.  PT HAS NOW LOST GAG REFLEX. FAMILY CONTINUES TO TAKE TURNS STAYING AT THE BEDSIDE.MUCH EMOTIONAL SUPPORT GIVEN BY STAFF.

## 2013-03-13 NOTE — Progress Notes (Signed)
Pt removed from BIPAP and placed on 100% NRB.  Pt tolerating well at this time.  Pt seems comfortable.  O2 sat 94-95%.  RT will continue to monitor.

## 2013-03-13 NOTE — Progress Notes (Signed)
PPT IS W/O HR,RESP,OR BP. PT PRONOUNCED EXPIRED. FAMILY AT BEDSIDE. DR Randol Kern CALLED AND NOTIFIED AT 1650

## 2013-03-13 NOTE — Progress Notes (Signed)
PT IS NOW ON COMFORT MEASURES.MANY FAMILY MEMBERS AT BEDSIDE, MUCH EMOTIONAL SUPPORT GIVEN TO FAMILY.

## 2013-03-13 DEATH — deceased

## 2013-03-14 ENCOUNTER — Other Ambulatory Visit (HOSPITAL_COMMUNITY): Payer: Self-pay

## 2013-03-14 ENCOUNTER — Telehealth: Payer: Self-pay | Admitting: Surgery

## 2013-03-14 ENCOUNTER — Ambulatory Visit: Payer: Self-pay | Admitting: Family

## 2013-03-14 NOTE — Telephone Encounter (Signed)
Jill Robinson called to let Jill Robinson know his mom passed away on December 24th of 2014.

## 2013-03-14 NOTE — Telephone Encounter (Signed)
Confirming date of death is on December 24,2014. Patient is already deceased in Epic.

## 2013-04-04 NOTE — Discharge Summary (Signed)
NAMELOLLIE, GUNNER NO.:  [id number]  MEDICAL RECORD NO.:  [id number]  LOCATION:  IC05                          FACILITY:  [facility]  PHYSICIAN:  Mila Homer. Sudie Bailey, M.D.DATE OF BIRTH:  January 08, 1941  DATE OF ADMISSION:  Mar 01, 2013 DATE OF DISCHARGE:  December 29, 2014LH                              DISCHARGE SUMMARY   DEATH SUMMARY:  This 73 year old woman was admitted to [facility] on 03/01/2013 and died there on 2013/03/10, after a rocky 10-day hospitalization.  She was admitted with severe anemia.  She also had a severe yeast infection, dehydration, and oxygen-dependent COPD.  Her admission chest x-ray showed cardiomegaly.  A chest x-ray dated before she died showed CHF and a probable right pleural effusion.  She had a PICC line at that time.  Her admission white cell count is 2600 and hemoglobin 4.8.  Her platelet count 211,000.  After transfusion of 6 units of blood, her hemoglobin came up to 10.6 recheck 11.8.  Her white cell count gradually came up to 5.9, and then 12.7 two days prior to her death and then 27,000 on the day prior to her death.  Hemoglobin stayed stable at 11.  Her 27,000 white cell count had 25% neutrophils, 5 lymphocytes, 16 myelocytes, and 43 bands.  Her haptoglobin was 323.  Her admission electrolytes showed a BUN 31, creatinine 1.62.  By the day prior to her death, the BUN had gone up to 60 with a creatinine 2.48. Her pro-beta natriuretic peptide was 2744.  Blood gases on oxygen mask with a 50% FiO2 showed pH 7.43, pCO2 49, PO2 52.  Her UA done March 01, 2013, showed positive nitrite, large leukocytes, and too numerous to count WBCs.  There were 7-10 RBCs and many bacteria.  She had grown E. coli.  This had a multiple resistance pattern including to ampicillin, ciprofloxacin, gentamicin, levofloxacin, trimethoprim/sulfamethoxazole, and intermediately to tobramycin and cefazolin.  It was sensitive to  ceftriaxone, nitrofurantoin, and piperacillin/tazobactam, however.  Wound culture grew out moderate methicillin-resistant Staph aureus.  She was admitted to the hospital.  The first order of business was transfusion.  It was felt that her lowered white cell count and hemoglobin were secondary to methotrexate toxicity, so methotrexate was stopped.  Eventually, her hemoglobin stabilized with a 6 blood transfusions, as noted above.  Her white cell count gradually increased as well.  The yeast infection was treated with fluconazole.  She was severely miserable due to the extent of the yeast infection, which involved all the intertriginous areas of her body.  Her diabetes was well controlled and dehydration eventually improved.  She developed renal insufficiency first felt to be secondary to dehydration but it persisted.  Several days before her demise, she was moved to the ICU because of respiratory insufficiency.  She was found to have an E. coli urinary tract infection, and was felt to have sepsis even though the blood cultures have been negative.  She continued to deteriorate rapidly on December 23 and 03/10/2013.  Dr. Karilyn Cota talked to the family on 03-10-13, and it was decided that she should be a no code at  this point.  She died shortly thereafter.  FINAL DISCHARGE DIAGNOSES: 1. Escherichia coli urinary tract infection. 2. Probable sepsis. 3. Severe anemia secondary to methotrexate toxicity. 4. Leukopenia secondary to methotrexate toxicity. 5. Type 2 diabetes mellitus. 6. Acute respiratory failure secondary to combination of congestive     heart failure, pneumonia, sepsis. 7. Pneumonia. 8. Acute tubular necrosis as a cause of her acute renal failure. 9. Severe psoriasis. 10.Morbid obesity.  She quietly expired at 4:45 p.m. on March 05, 2013.  She had no heart rate, spontaneous respirations and was pronounced dead at that time. The nurse called Dr.  Karilyn CotaGosrani at this point.     Mila HomerStephen D. Sudie BaileyKnowlton, M.D.     SDK/MEDQ  D:  04/03/2013  T:  04/04/2013  Job:  [id number]

## 2015-02-24 IMAGING — CR DG CHEST 1V PORT
1 series · 1 of 1 positions shown · non-contrast
Comparison: CT chest 04/25/2010 PA and lateral chest 03/07/2010 and
single view of the chest 12/30/2012.

CLINICAL DATA: COPD.

EXAM:
PORTABLE CHEST - 1 VIEW

[portable]
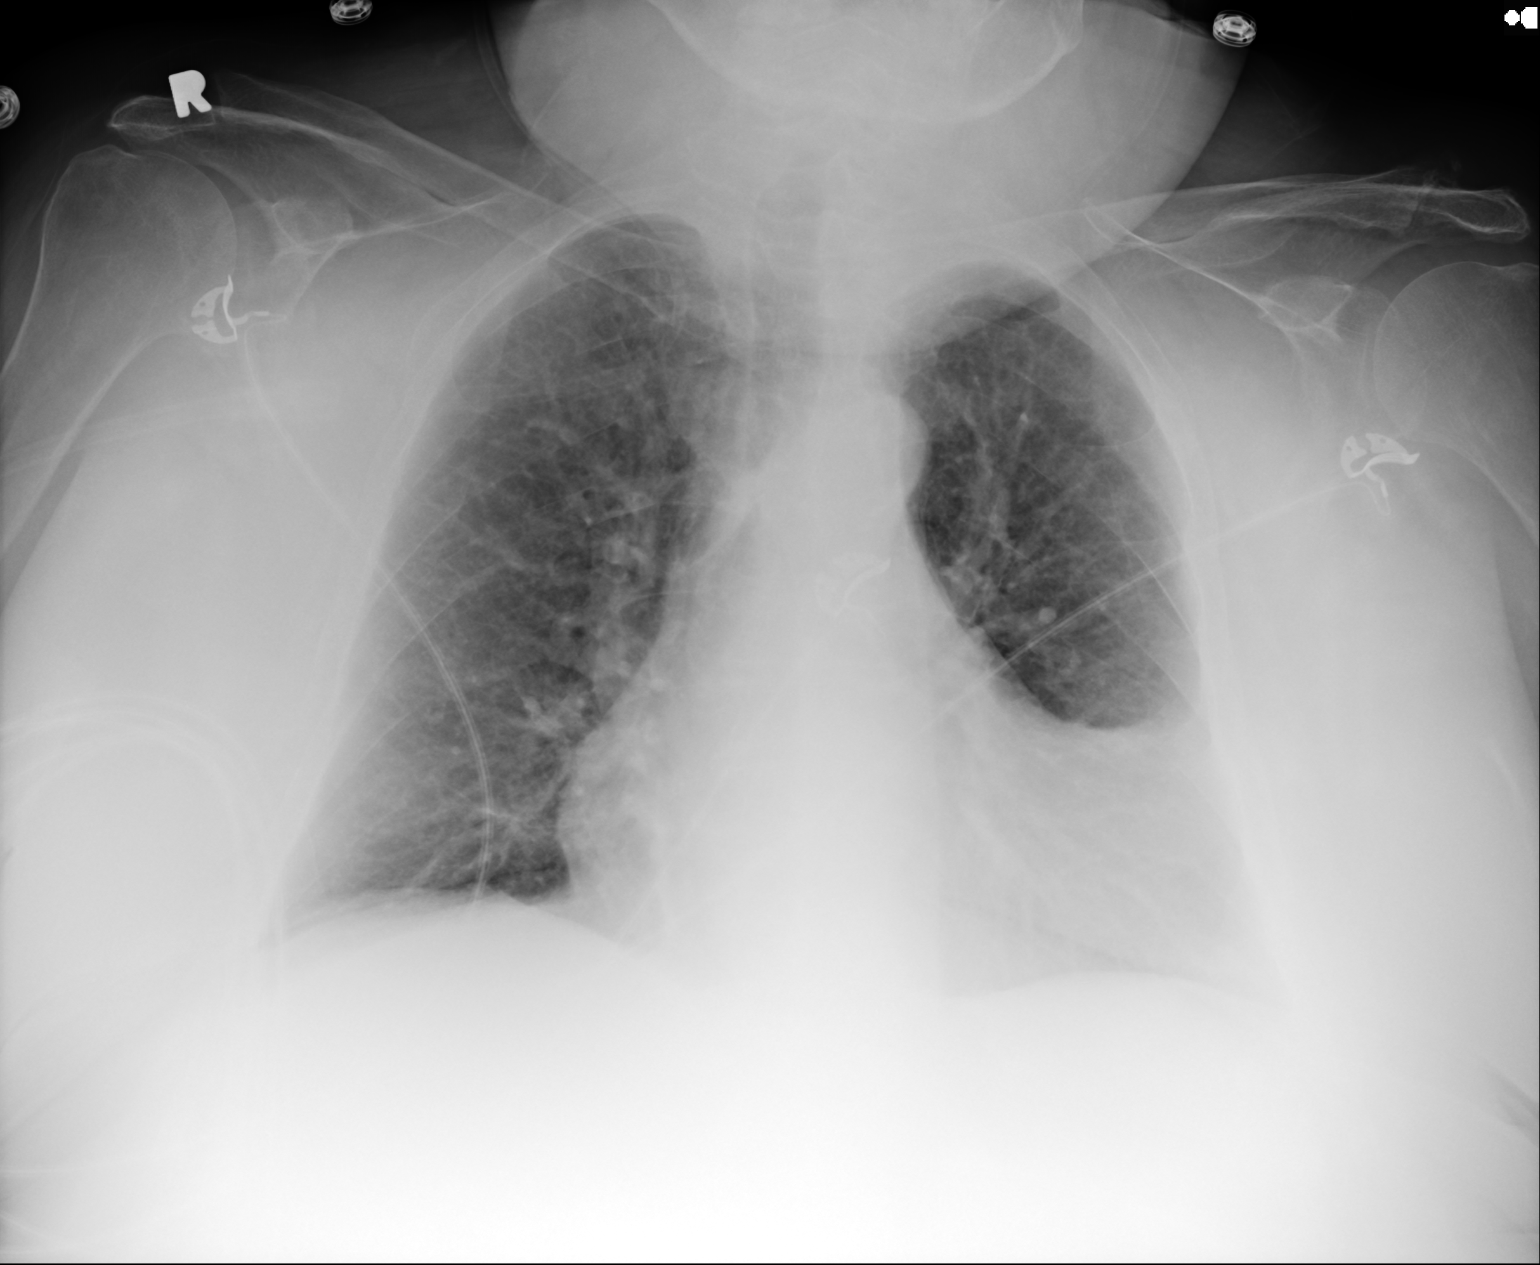

[1 of 1 positions shown; findings below may reference images not displayed]

FINDINGS: There is cardiomegaly and vascular congestion. The lungs are
emphysematous. No consolidative process, pneumothorax or effusion.
IMPRESSION: Cardiomegaly and pulmonary vascular congestion.
